# Patient Record
Sex: Female | Born: 1955 | Race: White | Hispanic: No | Marital: Single | Smoking: Never smoker
Health system: Southern US, Community
[De-identification: ages and names within clinical notes are randomized; demographics above are authoritative.]

## PROBLEM LIST (undated history)

## (undated) DIAGNOSIS — H9190 Unspecified hearing loss, unspecified ear: Secondary | ICD-10-CM

## (undated) DIAGNOSIS — E611 Iron deficiency: Secondary | ICD-10-CM

## (undated) DIAGNOSIS — R87619 Unspecified abnormal cytological findings in specimens from cervix uteri: Secondary | ICD-10-CM

## (undated) HISTORY — DX: Unspecified abnormal cytological findings in specimens from cervix uteri: R87.619

## (undated) HISTORY — DX: Iron deficiency: E61.1

## (undated) HISTORY — DX: Unspecified hearing loss, unspecified ear: H91.90

---

## 1983-12-22 HISTORY — PX: TUBAL LIGATION: SHX77

## 2004-04-22 DIAGNOSIS — R87619 Unspecified abnormal cytological findings in specimens from cervix uteri: Secondary | ICD-10-CM

## 2004-04-22 HISTORY — DX: Unspecified abnormal cytological findings in specimens from cervix uteri: R87.619

## 2005-10-20 HISTORY — PX: GASTRIC BYPASS: SHX52

## 2013-05-18 LAB — HM COLONOSCOPY: HM Colonoscopy: NORMAL

## 2013-05-18 LAB — HM MAMMOGRAPHY: HM Mammogram: NORMAL

## 2014-05-13 ENCOUNTER — Ambulatory Visit: Payer: Self-pay | Admitting: Family Medicine

## 2014-05-17 ENCOUNTER — Encounter: Payer: Self-pay | Admitting: Family Medicine

## 2014-05-17 ENCOUNTER — Ambulatory Visit (INDEPENDENT_AMBULATORY_CARE_PROVIDER_SITE_OTHER): Payer: BLUE CROSS/BLUE SHIELD | Admitting: Family Medicine

## 2014-05-17 VITALS — BP 114/62 | HR 68 | Ht 68.0 in | Wt 217.0 lb

## 2014-05-17 DIAGNOSIS — R635 Abnormal weight gain: Secondary | ICD-10-CM

## 2014-05-17 DIAGNOSIS — E611 Iron deficiency: Secondary | ICD-10-CM | POA: Insufficient documentation

## 2014-05-17 DIAGNOSIS — H9193 Unspecified hearing loss, bilateral: Secondary | ICD-10-CM

## 2014-05-17 DIAGNOSIS — H9190 Unspecified hearing loss, unspecified ear: Secondary | ICD-10-CM

## 2014-05-17 HISTORY — DX: Iron deficiency: E61.1

## 2014-05-17 HISTORY — DX: Unspecified hearing loss, unspecified ear: H91.90

## 2014-05-17 LAB — CBC
HEMATOCRIT: 42.4 % (ref 36.0–46.0)
HEMOGLOBIN: 14.1 g/dL (ref 12.0–15.0)
MCH: 27.8 pg (ref 26.0–34.0)
MCHC: 33.3 g/dL (ref 30.0–36.0)
MCV: 83.6 fL (ref 78.0–100.0)
MPV: 9.1 fL (ref 8.6–12.4)
Platelets: 320 10*3/uL (ref 150–400)
RBC: 5.07 MIL/uL (ref 3.87–5.11)
RDW: 14.1 % (ref 11.5–15.5)
WBC: 8.6 10*3/uL (ref 4.0–10.5)

## 2014-05-17 LAB — BASIC METABOLIC PANEL WITH GFR
BUN: 12 mg/dL (ref 6–23)
CALCIUM: 9.6 mg/dL (ref 8.4–10.5)
CO2: 25 mEq/L (ref 19–32)
CREATININE: 0.75 mg/dL (ref 0.50–1.10)
Chloride: 106 mEq/L (ref 96–112)
GFR, Est African American: >89 mL/min
GFR, Est Non African American: 89 mL/min
GLUCOSE: 107 mg/dL — AB (ref 70–99)
Potassium: 4.7 mEq/L (ref 3.5–5.3)
Sodium: 141 mEq/L (ref 135–145)

## 2014-05-17 LAB — IRON AND TIBC
%SAT: 12 % — ABNORMAL LOW (ref 20–55)
Iron: 39 ug/dL — ABNORMAL LOW (ref 42–145)
TIBC: 330 ug/dL (ref 250–470)
UIBC: 291 ug/dL (ref 125–400)

## 2014-05-17 NOTE — Progress Notes (Signed)
CC: Alexis Reyes is a 59 y.o. female is here for Establish Care   Subjective: HPI:  Very pleasant 59 year old here to establish care  She reports a history of iron deficiency and it is uncertain whether or not she ever was anemic due to this but going through her chart over the past year hemoglobin has always remained in the low normal range with an MCV less than 80. She's only been able to tolerate ferrous sulfate 325 mg once a day anything above this causes intolerable constipation. She wants to know of her iron levels today can be checked. There is been no recent bleeding or easy bruisability.  She reports difficulty with hearing bilaterally. It is accompanied by tinnitus bilaterally. Symptoms are mild to moderate in severity. They can occur in any situation. She gives me an example of being in a meeting today where she was unable to hear others talking around her. Nothing seems to make symptoms better or worse. She denies any history of trauma to either ear but does admit to listening to loud music over the course of her life. There is no ear pain or dizziness.  She reports difficulty with losing weight ever since she went through menopause, years ago. She is about to start the [employer] diet and wants to know if it is safe to go through with this.she is not familiar with what dietary restriction she'll have or if she'll be put on supplements/prescriptions. She's had difficulty losing weight with exercise and diet modification on her own.  Review of Systems - General ROS: negative for - chills, fever, night sweats,  weight loss Ophthalmic ROS: negative for - decreased vision Psychological ROS: negative for - anxiety or depression ENT ROS: negative for - hearing change, nasal congestion, tinnitus or allergies Hematological and Lymphatic ROS: negative for - bleeding problems, bruising or swollen lymph nodes Breast ROS: negative Respiratory ROS: no cough, shortness of breath, or  wheezing Cardiovascular ROS: no chest pain or dyspnea on exertion Gastrointestinal ROS: no abdominal pain, change in bowel habits, or black or bloody stools Genito-Urinary ROS: negative for - genital discharge, genital ulcers, incontinence or abnormal bleeding from genitals Musculoskeletal ROS: negative for - joint pain or muscle pain Neurological ROS: negative for - headaches or memory loss Dermatological ROS: negative for lumps, mole changes, rash and skin lesion changes  Past Medical History  Diagnosis Date  . Iron deficiency 05/17/2014  . Hearing loss 05/17/2014    Past Surgical History  Procedure Laterality Date  . Gastric bypass     No family history on file.  History   Social History  . Marital Status: Single    Spouse Name: N/A    Number of Children: N/A  . Years of Education: N/A   Occupational History  . Not on file.   Social History Main Topics  . Smoking status: Not on file  . Smokeless tobacco: Not on file  . Alcohol Use: Not on file  . Drug Use: Not on file  . Sexual Activity: Not on file   Other Topics Concern  . Not on file   Social History Narrative  . No narrative on file     Objective: BP 114/62 mmHg  Pulse 68  Ht 5\' 8"  (1.727 m)  Wt 217 lb (98.431 kg)  BMI 33.00 kg/m2  General: Alert and Oriented, No Acute Distress HEENT: Pupils equal, round, reactive to light. Conjunctivae clear.  External ears unremarkable, canals clear with intact TMs with appropriate landmarks.  Middle  ear appears open without effusion. Rinne test normal bilaterally, Weber lateralizes to the right Lungs: clear comfortable work of breathing Cardiac: Regular rate and rhythm.  Abdomen: mild obesity Extremities: No peripheral edema.  Strong peripheral pulses.  Mental Status: No depression, anxiety, nor agitation. Skin: Warm and dry.  Assessment & Plan: Otis was seen today for establish care.  Diagnoses and associated orders for this visit:  Iron deficiency - Iron  and TIBC - CBC  Hearing loss, bilateral - Ambulatory referral to Audiology - Iron and TIBC - CBC - BASIC METABOLIC PANEL WITH GFR  Abnormal weight gain    Iron deficiency: checking iron, MCV, hemoglobin To help determine if she needs to adjust her oral iron supplementation Bilateral hearing loss: Referral to audiology, suspect that hearing loss is conductive. Abnormal weight gain: Check a metabolic panel, I reviewed the [employer] diet plan website and was unable to provide her with any recommendations since they do not list what dietary restrictions their clients undergo or if they're provided with any supplements/prescription. I let the patient noted that after her consultation in February with this company if they give her any of the above information I be happy to review it for her if she drops off at my office.  Ultimate follow-up will be based on the above results   Return if symptoms worsen or fail to improve.

## 2014-05-18 ENCOUNTER — Encounter: Payer: Self-pay | Admitting: *Deleted

## 2014-05-18 ENCOUNTER — Telehealth: Payer: Self-pay | Admitting: Family Medicine

## 2014-05-18 DIAGNOSIS — R739 Hyperglycemia, unspecified: Secondary | ICD-10-CM

## 2014-05-18 NOTE — Telephone Encounter (Signed)
Seth Bake, Will you please let patient know that she still has a mild iron deficiency however it's not causing any degree of anemia so I don't feel that she needs to adjust her current iron supplementation regimen.  Her non-fasting blood sugar was mildly elevated so I'd recommend she have an A1c obtained, I'll print off an order if this can not be added on to yesterdays labs.

## 2014-05-18 NOTE — Telephone Encounter (Signed)
Pt.notified

## 2014-05-19 ENCOUNTER — Ambulatory Visit (INDEPENDENT_AMBULATORY_CARE_PROVIDER_SITE_OTHER): Payer: BLUE CROSS/BLUE SHIELD | Admitting: Obstetrics & Gynecology

## 2014-05-19 ENCOUNTER — Encounter: Payer: Self-pay | Admitting: Obstetrics & Gynecology

## 2014-05-19 VITALS — BP 107/64 | HR 62 | Resp 16 | Ht 67.0 in | Wt 218.0 lb

## 2014-05-19 DIAGNOSIS — Z1151 Encounter for screening for human papillomavirus (HPV): Secondary | ICD-10-CM

## 2014-05-19 DIAGNOSIS — N841 Polyp of cervix uteri: Secondary | ICD-10-CM

## 2014-05-19 DIAGNOSIS — Z01411 Encounter for gynecological examination (general) (routine) with abnormal findings: Secondary | ICD-10-CM | POA: Diagnosis not present

## 2014-05-19 DIAGNOSIS — Z124 Encounter for screening for malignant neoplasm of cervix: Secondary | ICD-10-CM

## 2014-05-19 NOTE — Addendum Note (Signed)
Addended by: Gretchen Short on: 05/19/2014 05:07 PM   Modules accepted: Orders

## 2014-05-19 NOTE — Progress Notes (Signed)
  Subjective:    Alexis Reyes is a 59 y.o. female who presents for an annual exam. The patient has no complaints today. The patient is not sexually active. GYN screening history: last pap: was normal. Had LEEP 10 years ago.  We ned records.  Also has history of endocervial polyps removed several years ago. The patient wears seatbelts: yes. The patient participates in regular exercise: no. Has the patient ever been transfused or tattooed?: no. The patient reports that there is not domestic violence in her life.   Menstrual History: OB History    Gravida Para Term Preterm AB TAB SAB Ectopic Multiple Living   2 2 2       2      No LMP recorded. Patient is postmenopausal.    The following portions of the patient's history were reviewed and updated as appropriate: allergies, current medications, past family history, past medical history, past social history, past surgical history and problem list.  Review of Systems Pertinent items are noted in HPI.    Objective:      Filed Vitals:   05/19/14 1558  BP: 107/64  Pulse: 62  Resp: 16  Height: 5\' 7"  (1.702 m)  Weight: 218 lb (98.884 kg)   Vitals:  WNL General appearance: alert, cooperative and no distress Head: Normocephalic, without obvious abnormality, atraumatic Eyes: negative Throat: lips, mucosa, and tongue normal; teeth and gums normal Lungs: clear to auscultation bilaterally Breasts: normal appearance, no masses or tenderness, No nipple retraction or dimpling, No nipple discharge or bleeding Heart: regular rate and rhythm Abdomen: soft, non-tender; bowel sounds normal; no masses,  no organomegaly  Pelvic:  External Genitalia:  Tanner V, no lesion Urethra:  No prolapse Vagina:  Pink, normal rugae, no blood or discharge Cervix:  4+ small endocervical polpys, miild uterine prolapse Uterus:  Normal size and contour, non tender Adnexa:  Normal, no masses, non tender Rectovaginal Septum:  Non tender, no masses  Extremities:  no edema, redness or tenderness in the calves or thighs Skin: no lesions or rash Lymph nodes: Axillary adenopathy: none     .    Assessment:    Healthy female exam.   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 Up to date with colonoscopy per patient.  4+ endocervical polyps were removed.  Bleeding stopped with silver nitrate.   Small polyp at 12 o clock could not be entirely removed.    RTC 3 weeks.  Plan:

## 2014-05-20 LAB — HEMOGLOBIN A1C
Hgb A1c MFr Bld: 5.4 % (ref <5.7)
Mean Plasma Glucose: 108 mg/dL (ref <117)

## 2014-05-23 LAB — CYTOLOGY - PAP

## 2014-05-26 ENCOUNTER — Telehealth: Payer: Self-pay | Admitting: *Deleted

## 2014-05-26 NOTE — Telephone Encounter (Signed)
Pt notified of neg pathology report of benign polyp.

## 2014-06-14 ENCOUNTER — Ambulatory Visit: Payer: BLUE CROSS/BLUE SHIELD | Admitting: Obstetrics & Gynecology

## 2014-06-22 ENCOUNTER — Ambulatory Visit: Payer: BLUE CROSS/BLUE SHIELD | Attending: Audiology | Admitting: Audiology

## 2014-06-22 DIAGNOSIS — H93293 Other abnormal auditory perceptions, bilateral: Secondary | ICD-10-CM

## 2014-06-22 DIAGNOSIS — H903 Sensorineural hearing loss, bilateral: Secondary | ICD-10-CM | POA: Diagnosis not present

## 2014-06-22 DIAGNOSIS — H9319 Tinnitus, unspecified ear: Secondary | ICD-10-CM | POA: Insufficient documentation

## 2014-06-22 DIAGNOSIS — H93213 Auditory recruitment, bilateral: Secondary | ICD-10-CM

## 2014-06-22 NOTE — Procedures (Signed)
[facility] [address] [place], [place] [address] [place] EVALUATION  Name: Alexis Reyes DOB:  01-28-1956 MRN:  [id number]                                 Diagnosis: Hearing loss, Tinnitus Date: 06/22/2014    Referent: Marcial Pacas, DO  HISTORY: Alexis Reyes, age 59 y.o. years, was seen for an audiological evaluation.  Her primary complaint is "difficulty hearing when people speak in a low voice or there is background noise".   She states that she had a hearing test about last year but that she has noticed her hearing and tinnitus getting worse over the past 3 years.  Alexis Reyes states that the tinnitus began about 3 years ago and now is constant - sometimes she cannot hear people over the loudness of the tinnitus. This is difficult for her at work and at meetings.  However, on a scale of 1 (no impact) -10 (10 ruined) she describes the tinnitus as a 3. Nyhla denies vertigo or balance issues.  Alexis Reyes denies a feeling of "pressure in the ears".  Alexis Reyes reports having an ear infection in the past.         EVALUATION: Pure tone air and tone conduction was completed using conventional audiometry with inserts.  Hearing thresholds are 15-20 dBHL from 250Hz  - 500Hz ; 25-30 dBHL at 1000Hz ; 55-65 dBHL at 1500Hz -4000Hz  and 50-60 dBHL from 6000Hz  - 8000Hz  bilaterally. The hearing loss is sensorineural with recruitment reported at [place] in each ear using monitored live voice.  Speech reception thresholds are 25 dBHL in the right ear and 25 dBHL in the left ear using recorded spondee words.  The reliability is good.  Word recognition is 56% at 65dBHL in the right and 68% at 65dBHL in the left using recorded NU-6 word lists in quiet. In minimal background noise with +5dB signal to noise ratio word recognition is 36 % in the right ear and 42% in the left ear. Otoscopic inspection reveals clear ear canals with visible  tympanic membranes.   Tympanometry has a long left leg bilaterally- the right ear has a large gradient of 235daPa  and in the left ear has a similar configuration.  Ipsilateral acoustic reflexes are within normal limits bilaterally.  Tinnitus was matched for loudness at  is 66dBHL using speech noise but she could not tell the pitch of the tinnitus. Suppression was positive 72 dBHL of speech noise for 60 seconds.   CONCLUSION:      Alexis Reyes has a severe communication deficit. Currently her word recognition is poor in quiet and drops to very poor in minimal background noise.  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   A progressive drop in word recognition and hearing thresholds is of concern. The tinnitus also seems to be worse than one year ago by history; however, since suppression was noted today Alexis Reyes may be a good candidate for tinnitus therapy.  As discussed with Alexis Reyes follow-up with an ENT is strongly recommended and she desires to follow-up with Dr. Royce Macadamia, ENT in [place], where she lives.    RECOMMENDATIONS: 1.   Further evaluation by Dr. Valora Corporal, ENT for tinnitus, hearing loss and poor word recognition. 2.    Monitor hearing closely with a repeat  audiological evaluation in 3-6 months (earlier if there is any change in hearing or ear pressure). 3.   To minimize the adverse effects of tinnitus 1) avoid quiet  2) use noise maskers at home such as a sound machine, quiet music, a fan or other background noise at a volume just loud enough to mask the high pitched tinnitus. 3) If the tinnitus becomes more bothersome, adversely affecting your sleep or concentration, contact your physician,  seek additional medical help by an ENT for further treatment of your tinnitus.   Coolidge Gossard L. Heide Spark, Au.D., CCC-A Doctor of Audiology   06/22/2014  cc: Marcial Pacas,  DO

## 2014-09-22 ENCOUNTER — Emergency Department (INDEPENDENT_AMBULATORY_CARE_PROVIDER_SITE_OTHER)
Admission: EM | Admit: 2014-09-22 | Discharge: 2014-09-22 | Disposition: A | Discharge status: Home / Self Care | Payer: BLUE CROSS/BLUE SHIELD | Source: Home / Self Care | Attending: Emergency Medicine | Admitting: Emergency Medicine

## 2014-09-22 ENCOUNTER — Encounter: Payer: Self-pay | Admitting: Emergency Medicine

## 2014-09-22 DIAGNOSIS — N3001 Acute cystitis with hematuria: Secondary | ICD-10-CM

## 2014-09-22 LAB — POCT URINALYSIS DIP (MANUAL ENTRY)
Bilirubin, UA: NEGATIVE
Glucose, UA: NEGATIVE
Ketones, POC UA: NEGATIVE
Leukocytes, UA: NEGATIVE
Nitrite, UA: NEGATIVE
Protein Ur, POC: NEGATIVE
Spec Grav, UA: <=1.005 (ref 1.005–1.03)
Urobilinogen, UA: 0.2 (ref 0–1)
pH, UA: 6 (ref 5–8)

## 2014-09-22 MED ORDER — CIPROFLOXACIN HCL 500 MG PO TABS: 500.0000 mg | ORAL_TABLET | Freq: Two times a day (BID) | ORAL | Status: DC

## 2014-09-22 NOTE — ED Provider Notes (Signed)
CSN: [id number]     Arrival date & time 09/22/14  1335 History   None    Chief Complaint  Patient presents with  . Dysuria    HPI This is a 59 y.o. female who presents today with UTI symptoms for 1 days.  + dysuria + frequency + urgency No hematuria No vaginal discharge No fever/chills No lower abdominal pain No nausea No vomiting No back pain No fatigue She denies chance of pregnancy. Has tried over-the-counter measures without improvement.    Past Medical History  Diagnosis Date  . Iron deficiency 05/17/2014  . Hearing loss 05/17/2014  . Abnormal Pap smear of cervix 2006    colpo   Past Surgical History  Procedure Laterality Date  . Gastric bypass  10/2005  . Tubal ligation  12/22/1983   Family History  Problem Relation Age of Onset  . Diabetes Mother     father   . Stroke Father    History  Substance Use Topics  . Smoking status: Never Smoker   . Smokeless tobacco: Never Used  . Alcohol Use: 0.6 oz/week    1 Standard drinks or equivalent per week   OB History    Gravida Para Term Preterm AB TAB SAB Ectopic Multiple Living   2 2 2       2      Review of Systems Remainder of Review of Systems negative for acute change except as noted in the HPI.  Allergies  Review of patient's allergies indicates no known allergies.  Home Medications   Prior to Admission medications   Medication Sig Start Date End Date Taking? Authorizing Provider  Ascorbic Acid (VITAMIN C) 1000 MG tablet Take 1,000 mg by mouth.    Historical Provider, MD  Calcium-Vitamin D-Vitamin K [id number] MG-UNT-MCG CHEW Chew by mouth.    Historical Provider, MD  ciprofloxacin (CIPRO) 500 MG tablet Take 1 tablet (500 mg total) by mouth 2 (two) times daily. For 7 days 09/22/14   Jacqulyn Cane, MD  Docusate Calcium (STOOL SOFTENER PO) Take by mouth.    Historical Provider, MD  IRON PO Take by mouth.    Historical Provider, MD  Multiple Vitamin (THERA) TABS Take 1 tablet by mouth.    Historical  Provider, MD  Multiple Vitamins-Minerals (MULTIVITAMIN ADULT PO) Take by mouth.    Historical Provider, MD  vitamin B-12 (CYANOCOBALAMIN) 1000 MCG tablet Take 1,000 mcg by mouth.    Historical Provider, MD  vitamin E 400 UNIT capsule Take 400 Units by mouth.    Historical Provider, MD   BP 103/69 mmHg  Pulse 73  Temp(Src) 98 F (36.7 C) (Oral)  Resp 16  Ht 5\' 8"  (1.727 m)  Wt 200 lb (90.719 kg)  BMI 30.42 kg/m2  SpO2 98% Physical Exam  Constitutional: She is oriented to person, place, and time. She appears well-developed and well-nourished. No distress.  HENT:  Mouth/Throat: Oropharynx is clear and moist.  Eyes: No scleral icterus.  Neck: Neck supple.  Cardiovascular: Normal rate, regular rhythm and normal heart sounds.   Pulmonary/Chest: Breath sounds normal.  Abdominal: Soft. She exhibits no mass. There is no hepatosplenomegaly. There is tenderness in the suprapubic area. There is no rebound, no guarding and no CVA tenderness.  Lymphadenopathy:    She has no cervical adenopathy.  Neurological: She is alert and oriented to person, place, and time.  Skin: Skin is warm and dry.  Nursing note and vitals reviewed.   ED Course  Procedures (including critical care time)  Labs Review Labs Reviewed  URINE CULTURE  POCT URINALYSIS DIP (MANUAL ENTRY)   Results for orders placed or performed during the hospital encounter of 09/22/14  POCT urinalysis dipstick (new)  Result Value Ref Range   Color, UA light yellow    Clarity, UA clear    Glucose, UA neg    Bilirubin, UA negative    Bilirubin, UA negative    Spec Grav, UA <=1.005 1.005 - 1.03   Blood, UA small    pH, UA 6.0 5 - 8   Protein Ur, POC negative    Urobilinogen, UA 0.2 0 - 1   Nitrite, UA Negative    Leukocytes, UA Negative      Imaging Review No results found.   MDM   1. Acute cystitis with hematuria    Treatment options discussed, as well as risks, benefits, alternatives. Patient voiced understanding and  agreement with the following plans: New Prescriptions   CIPROFLOXACIN (CIPRO) 500 MG TABLET    Take 1 tablet (500 mg total) by mouth 2 (two) times daily. For 7 days   other symptomatic care discussed Urine culture Follow-up with your primary care doctor in 5-7 days if not improving, or sooner if symptoms become worse. Precautions discussed. Red flags discussed. Questions invited and answered. Patient voiced understanding and agreement.      Jacqulyn Cane, MD 09/22/14 [phone]

## 2014-09-24 ENCOUNTER — Telehealth: Payer: Self-pay | Admitting: *Deleted

## 2014-09-24 LAB — URINE CULTURE
Colony Count: NO GROWTH
Organism ID, Bacteria: NO GROWTH

## 2014-09-26 ENCOUNTER — Telehealth: Payer: Self-pay | Admitting: *Deleted

## 2014-09-26 MED ORDER — FLUCONAZOLE 150 MG PO TABS: 150.0000 mg | ORAL_TABLET | Freq: Every day | ORAL | Status: DC

## 2014-10-03 ENCOUNTER — Emergency Department (INDEPENDENT_AMBULATORY_CARE_PROVIDER_SITE_OTHER)
Admission: EM | Admit: 2014-10-03 | Discharge: 2014-10-03 | Disposition: A | Discharge status: Home / Self Care | Payer: BLUE CROSS/BLUE SHIELD | Source: Home / Self Care | Attending: Emergency Medicine | Admitting: Emergency Medicine

## 2014-10-03 ENCOUNTER — Encounter: Payer: Self-pay | Admitting: Emergency Medicine

## 2014-10-03 DIAGNOSIS — N3001 Acute cystitis with hematuria: Secondary | ICD-10-CM | POA: Diagnosis not present

## 2014-10-03 LAB — POCT URINALYSIS DIP (MANUAL ENTRY)
Bilirubin, UA: NEGATIVE
Glucose, UA: NEGATIVE
Ketones, POC UA: NEGATIVE
Nitrite, UA: NEGATIVE
Protein Ur, POC: NEGATIVE
Spec Grav, UA: 1.015 (ref 1.005–1.03)
Urobilinogen, UA: 0.2 (ref 0–1)
pH, UA: 6 (ref 5–8)

## 2014-10-03 MED ORDER — FLUCONAZOLE 150 MG PO TABS: 150.0000 mg | ORAL_TABLET | Freq: Once | ORAL | Status: DC

## 2014-10-03 MED ORDER — NITROFURANTOIN MONOHYD MACRO 100 MG PO CAPS: 100.0000 mg | ORAL_CAPSULE | Freq: Two times a day (BID) | ORAL | Status: DC

## 2014-10-03 NOTE — ED Notes (Signed)
Pt c/o urinary frequency, urgency pain and pressure. States she finished cipro on 6/10... No improvement.

## 2014-10-03 NOTE — ED Provider Notes (Signed)
CSN: [id number]     Arrival date & time 10/03/14  0805 History   First MD Initiated Contact with Patient 10/03/14 0820     Chief Complaint  Patient presents with  . Urinary Tract Infection    HPI This is a 58 y.o. female who presents today with UTI symptoms for 11 days.  Was seen here on June 6, prescribed Cipro for UTI, she states she improved somewhat but never completely improved. Urine culture showed no growth.  + dysuria + frequency + urgency No hematuria No vaginal discharge No fever/chills No lower abdominal pain No nausea No vomiting No back pain No fatigue She denies chance of pregnancy.     Past Medical History  Diagnosis Date  . Iron deficiency 05/17/2014  . Hearing loss 05/17/2014  . Abnormal Pap smear of cervix 2006    colpo   Past Surgical History  Procedure Laterality Date  . Gastric bypass  10/2005  . Tubal ligation  12/22/1983   Family History  Problem Relation Age of Onset  . Diabetes Mother     father   . Stroke Father    History  Substance Use Topics  . Smoking status: Never Smoker   . Smokeless tobacco: Never Used  . Alcohol Use: 0.6 oz/week    1 Standard drinks or equivalent per week   OB History    Gravida Para Term Preterm AB TAB SAB Ectopic Multiple Living   2 2 2       2      Review of Systems Remainder of Review of Systems negative for acute change except as noted in the HPI.  Allergies  Review of patient's allergies indicates no known allergies.  Home Medications   Prior to Admission medications   Medication Sig Start Date End Date Taking? Authorizing Provider  Ascorbic Acid (VITAMIN C) 1000 MG tablet Take 1,000 mg by mouth.    Historical Provider, MD  Calcium-Vitamin D-Vitamin K [id number] MG-UNT-MCG CHEW Chew by mouth.    Historical Provider, MD  Docusate Calcium (STOOL SOFTENER PO) Take by mouth.    Historical Provider, MD  fluconazole (DIFLUCAN) 150 MG tablet Take 1 tablet (150 mg total) by mouth once. Take 1 now, then  may repeat x1 in 4 days, for yeast infection. 10/03/14   Jacqulyn Cane, MD  IRON PO Take by mouth.    Historical Provider, MD  Multiple Vitamin (THERA) TABS Take 1 tablet by mouth.    Historical Provider, MD  Multiple Vitamins-Minerals (MULTIVITAMIN ADULT PO) Take by mouth.    Historical Provider, MD  nitrofurantoin, macrocrystal-monohydrate, (MACROBID) 100 MG capsule Take 1 capsule (100 mg total) by mouth 2 (two) times daily. 10/03/14   Jacqulyn Cane, MD  vitamin B-12 (CYANOCOBALAMIN) 1000 MCG tablet Take 1,000 mcg by mouth.    Historical Provider, MD  vitamin E 400 UNIT capsule Take 400 Units by mouth.    Historical Provider, MD   BP 105/69 mmHg  Pulse 66  Temp(Src) 98.4 F (36.9 C) (Oral)  SpO2 98% Physical Exam  Constitutional: She is oriented to person, place, and time. She appears well-developed and well-nourished. No distress.  HENT:  Head: Normocephalic and atraumatic.  Eyes: Conjunctivae and EOM are normal. Pupils are equal, round, and reactive to light. No scleral icterus.  Neck: Normal range of motion.  Cardiovascular: Normal rate.   Pulmonary/Chest: Effort normal.  Abdominal: She exhibits no distension.  Musculoskeletal: Normal range of motion.  Neurological: She is alert and oriented to person, place, and time.  Skin: Skin is warm.  Psychiatric: She has a normal mood and affect.  Nursing note and vitals reviewed.  she declined any other physical exam today  ED Course  Procedures (including critical care time) Labs Review Labs Reviewed  URINE CULTURE  POCT URINALYSIS DIP (MANUAL ENTRY)   Results for orders placed or performed during the hospital encounter of 09/22/14  Urine culture  Result Value Ref Range   Colony Count NO GROWTH    Organism ID, Bacteria NO GROWTH                                                                      Today's urinalysis: Positive for small amount of blood and trace leukocytes.  MDM   1. Acute cystitis with hematuria    Treatment options discussed, as well as risks, benefits, alternatives. Patient voiced understanding and agreement with the following plans:  Macrobid prescribed, she states that's worked well in the past. Diflucan rx in case she gets yeast infection I urged her to follow up with PCP and/or GYN and/or urologist within 7-10 days, sooner if worse or new or persistent symptoms. I gave her names and contact information for local urologist. Precautions discussed. Red flags discussed. Questions invited and answered. Patient voiced understanding and agreement.  Precautions and red flags discussed. She voiced understanding and agreement   Jacqulyn Cane, MD 10/03/14 [phone]

## 2014-10-05 ENCOUNTER — Telehealth: Payer: Self-pay | Admitting: *Deleted

## 2014-10-05 LAB — URINE CULTURE
Colony Count: NO GROWTH
Organism ID, Bacteria: NO GROWTH

## 2014-11-30 ENCOUNTER — Encounter: Payer: Self-pay | Admitting: Family Medicine

## 2014-11-30 ENCOUNTER — Ambulatory Visit (INDEPENDENT_AMBULATORY_CARE_PROVIDER_SITE_OTHER): Payer: BLUE CROSS/BLUE SHIELD | Admitting: Family Medicine

## 2014-11-30 VITALS — BP 102/59 | HR 68 | Ht 68.0 in | Wt 203.0 lb

## 2014-11-30 DIAGNOSIS — Z0000 Encounter for general adult medical examination without abnormal findings: Secondary | ICD-10-CM | POA: Diagnosis not present

## 2014-11-30 DIAGNOSIS — R319 Hematuria, unspecified: Secondary | ICD-10-CM | POA: Diagnosis not present

## 2014-11-30 DIAGNOSIS — R079 Chest pain, unspecified: Secondary | ICD-10-CM

## 2014-11-30 DIAGNOSIS — L989 Disorder of the skin and subcutaneous tissue, unspecified: Secondary | ICD-10-CM

## 2014-11-30 DIAGNOSIS — E785 Hyperlipidemia, unspecified: Secondary | ICD-10-CM | POA: Diagnosis not present

## 2014-11-30 DIAGNOSIS — Z1239 Encounter for other screening for malignant neoplasm of breast: Secondary | ICD-10-CM | POA: Diagnosis not present

## 2014-11-30 LAB — POCT URINALYSIS DIPSTICK
BILIRUBIN UA: NEGATIVE
Glucose, UA: NEGATIVE
Ketones, UA: NEGATIVE
LEUKOCYTES UA: NEGATIVE
NITRITE UA: NEGATIVE
PH UA: 7
PROTEIN UA: NEGATIVE
Spec Grav, UA: 1.015
Urobilinogen, UA: 0.2

## 2014-11-30 NOTE — Progress Notes (Signed)
CC: Alexis Reyes is a 59 y.o. female is here for Annual Exam   Subjective: HPI:  Colonoscopy: UTD as of 2015 Papsmear: Normal this year Mammogram: Normal 2015, repeat overdue, orders placed  Influenza Vaccine: No current indication Pneumovax: No current indication Td/Tdap: Up-to-date Zoster: (Start 58 yo)  Requesting complete physical exam  Acute complaint of chest pain that she has been attributed to gastric reflux symptoms. Nothing particularly makes it better or worse. It is not predictable. It can happen anytime of the day. No exertional component. She is not having any pain right now but would like an EKG.  Her father was diagnosed with carotid artery disease that required stenting. She is worried that she might have carotid artery disease herself. She denies any recent motor or sensory disturbances other than dysuria. She would like she can have an ultrasound of her carotids.  Review of Systems - General ROS: negative for - chills, fever, night sweats, weight gain or weight loss Ophthalmic ROS: negative for - decreased vision Psychological ROS: negative for - anxiety or depression ENT ROS: negative for - hearing change, nasal congestion, tinnitus or allergies Hematological and Lymphatic ROS: negative for - bleeding problems, bruising or swollen lymph nodes Breast ROS: negative Respiratory ROS: no cough, shortness of breath, or wheezing Cardiovascular ROS: no  dyspnea on exertion Gastrointestinal ROS: no abdominal pain, change in bowel habits, or black or bloody stools Genito-Urinary ROS: negative for - genital discharge, genital ulcers, incontinence or abnormal bleeding from genitals Musculoskeletal ROS: negative for - joint pain or muscle pain Neurological ROS: negative for - headaches or memory loss Dermatological ROS: negative for lumps, mole changes, rash and skin lesion changes  Past Medical History  Diagnosis Date  . Iron deficiency 05/17/2014  . Hearing loss  05/17/2014  . Abnormal Pap smear of cervix 2006    colpo    Past Surgical History  Procedure Laterality Date  . Gastric bypass  10/2005  . Tubal ligation  12/22/1983   Family History  Problem Relation Age of Onset  . Diabetes Mother     father   . Stroke Father     Social History   Social History  . Marital Status: Single    Spouse Name: N/A  . Number of Children: N/A  . Years of Education: N/A   Occupational History  . Not on file.   Social History Main Topics  . Smoking status: Never Smoker   . Smokeless tobacco: Never Used  . Alcohol Use: 0.6 oz/week    1 Standard drinks or equivalent per week  . Drug Use: No  . Sexual Activity:    Partners: Male   Other Topics Concern  . Not on file   Social History Narrative     Objective: BP 102/59 mmHg  Pulse 68  Ht 5\' 8"  (1.727 m)  Wt 203 lb (92.08 kg)  BMI 30.87 kg/m2  General: No Acute Distress HEENT: Atraumatic, normocephalic, conjunctivae normal without scleral icterus.  No nasal discharge, hearing grossly intact, TMs with good landmarks bilaterally with no middle ear abnormalities, posterior pharynx clear without oral lesions. Neck: Supple, trachea midline, no cervical nor supraclavicular adenopathy. Pulmonary: Clear to auscultation bilaterally without wheezing, rhonchi, nor rales. Cardiac: Regular rate and rhythm.  No murmurs, rubs, nor gallops. No peripheral edema.  2+ peripheral pulses bilaterally. No carotid bruits Abdomen: Mild obesity soft MSK: Grossly intact, no signs of weakness.  Full strength throughout upper and lower extremities.  Full ROM in upper and lower  extremities.  No midline spinal tenderness. Neuro: Gait unremarkable, CN II-XII grossly intact.  C5-C6 Reflex 2/4 Bilaterally, L4 Reflex 2/4 Bilaterally.  Cerebellar function intact. Skin: No rashes other than noninflamed seborrheic keratoses on the back Psych: Alert and oriented to person/place/time.  Thought process normal. No  anxiety/depression.   Assessment & Plan: Alexis Reyes was seen today for annual exam.  Diagnoses and all orders for this visit:  Annual physical exam -     CBC -     COMPLETE METABOLIC PANEL WITH GFR -     Lipid panel  Screening for breast cancer -     MM DIGITAL SCREENING BILATERAL  Skin lesion -     Ambulatory referral to Dermatology  Hyperlipidemia -     US Carotid Duplex Bilateral  Hematuria -     POCT urinalysis dipstick -     Urine Culture  Chest pain, unspecified chest pain type   Healthy lifestyle interventions including but not limited to regular exercise, a healthy low fat diet, moderation of salt intake, the dangers of tobacco/alcohol/recreational drug use, nutrition supplementation, and accident avoidance were discussed with the patient and a handout was provided for future reference. EKG was obtained showing sinus bradycardia, normal intervals, normal axis, no pathologic Q waves, no ST segment elevation or depression. Reassurance provided low suspicion for cardiac etiology of pain so far however obtain labs above. Reassurance provided about her seborrheic keratoses, she would like to have a second opinion with dermatology. Urinalysis today shows blood, sending for urine culture before starting antibiotic. Discussed that there is no strong evidence for promotion of screening carotid ultrasounds for a asymptomatic patient however she would still gets on like to have this done.   Return if symptoms worsen or fail to improve.

## 2014-12-01 ENCOUNTER — Other Ambulatory Visit: Payer: Self-pay | Admitting: Family Medicine

## 2014-12-01 DIAGNOSIS — D509 Iron deficiency anemia, unspecified: Secondary | ICD-10-CM

## 2014-12-01 NOTE — Addendum Note (Signed)
Addended by: Huel Cote on: 12/01/2014 09:12 AM   Modules accepted: Orders

## 2014-12-02 LAB — URINE CULTURE

## 2014-12-07 LAB — COMPLETE METABOLIC PANEL WITH GFR
ALT: 14 U/L (ref 6–29)
AST: 18 U/L (ref 10–35)
Albumin: 3.7 g/dL (ref 3.6–5.1)
Alkaline Phosphatase: 70 U/L (ref 33–130)
BUN: 10 mg/dL (ref 7–25)
CHLORIDE: 105 mmol/L (ref 98–110)
CO2: 29 mmol/L (ref 20–31)
Calcium: 9.2 mg/dL (ref 8.6–10.4)
Creat: 0.76 mg/dL (ref 0.50–1.05)
GFR, EST NON AFRICAN AMERICAN: 87 mL/min (ref >=60)
GFR, Est African American: >89 mL/min (ref >=60)
GLUCOSE: 76 mg/dL (ref 65–99)
POTASSIUM: 4.2 mmol/L (ref 3.5–5.3)
SODIUM: 141 mmol/L (ref 135–146)
Total Bilirubin: 0.5 mg/dL (ref 0.2–1.2)
Total Protein: 6.5 g/dL (ref 6.1–8.1)

## 2014-12-07 LAB — CBC
HCT: 40.6 % (ref 36.0–46.0)
Hemoglobin: 13.2 g/dL (ref 12.0–15.0)
MCH: 27.5 pg (ref 26.0–34.0)
MCHC: 32.5 g/dL (ref 30.0–36.0)
MCV: 84.6 fL (ref 78.0–100.0)
MPV: 9.3 fL (ref 8.6–12.4)
PLATELETS: 250 10*3/uL (ref 150–400)
RBC: 4.8 MIL/uL (ref 3.87–5.11)
RDW: 14.1 % (ref 11.5–15.5)
WBC: 6.8 10*3/uL (ref 4.0–10.5)

## 2014-12-07 LAB — LIPID PANEL
Cholesterol: 149 mg/dL (ref 125–200)
HDL: 42 mg/dL — AB (ref >=46)
LDL CALC: 92 mg/dL (ref <130)
Total CHOL/HDL Ratio: 3.5 Ratio (ref <=5.0)
Triglycerides: 76 mg/dL (ref <150)
VLDL: 15 mg/dL (ref <30)

## 2014-12-07 LAB — FERRITIN: Ferritin: 39 ng/mL (ref 10–291)

## 2014-12-13 ENCOUNTER — Ambulatory Visit (HOSPITAL_BASED_OUTPATIENT_CLINIC_OR_DEPARTMENT_OTHER)
Admission: RE | Admit: 2014-12-13 | Discharge: 2014-12-13 | Disposition: A | Discharge status: Home / Self Care | Payer: BLUE CROSS/BLUE SHIELD | Source: Ambulatory Visit | Attending: Family Medicine | Admitting: Family Medicine

## 2014-12-13 DIAGNOSIS — E785 Hyperlipidemia, unspecified: Secondary | ICD-10-CM | POA: Insufficient documentation

## 2014-12-13 DIAGNOSIS — Z8249 Family history of ischemic heart disease and other diseases of the circulatory system: Secondary | ICD-10-CM | POA: Insufficient documentation

## 2014-12-13 DIAGNOSIS — I6523 Occlusion and stenosis of bilateral carotid arteries: Secondary | ICD-10-CM | POA: Insufficient documentation

## 2014-12-13 DIAGNOSIS — Z1231 Encounter for screening mammogram for malignant neoplasm of breast: Secondary | ICD-10-CM | POA: Diagnosis not present

## 2014-12-14 ENCOUNTER — Telehealth: Payer: Self-pay | Admitting: Family Medicine

## 2014-12-14 DIAGNOSIS — I6529 Occlusion and stenosis of unspecified carotid artery: Secondary | ICD-10-CM

## 2014-12-14 MED ORDER — ASPIRIN EC 81 MG PO TBEC: 81.0000 mg | DELAYED_RELEASE_TABLET | Freq: Every day | ORAL | Status: AC

## 2014-12-14 NOTE — Telephone Encounter (Signed)
Alexis Reyes, Will you please let patient know that her ultrasound showed mild cholesterol buildup in her carotid arteries that are thankfully not significantly interfering with blood flow. Since her cholesterol was controlled My only recommendation would be to start a 81mg  aspirin daily to help reduce the risk of stroke.

## 2014-12-14 NOTE — Telephone Encounter (Signed)
Left message on vm with results  

## 2015-07-18 ENCOUNTER — Encounter: Payer: Self-pay | Admitting: Obstetrics & Gynecology

## 2015-07-18 ENCOUNTER — Ambulatory Visit (INDEPENDENT_AMBULATORY_CARE_PROVIDER_SITE_OTHER): Payer: Managed Care, Other (non HMO) | Admitting: Obstetrics & Gynecology

## 2015-07-18 VITALS — BP 102/63 | HR 62 | Resp 16 | Ht 68.0 in | Wt 206.0 lb

## 2015-07-18 DIAGNOSIS — N812 Incomplete uterovaginal prolapse: Secondary | ICD-10-CM | POA: Diagnosis not present

## 2015-07-18 DIAGNOSIS — Z124 Encounter for screening for malignant neoplasm of cervix: Secondary | ICD-10-CM

## 2015-07-18 DIAGNOSIS — Z1151 Encounter for screening for human papillomavirus (HPV): Secondary | ICD-10-CM | POA: Diagnosis not present

## 2015-07-18 DIAGNOSIS — N8110 Cystocele, unspecified: Secondary | ICD-10-CM | POA: Diagnosis not present

## 2015-07-18 DIAGNOSIS — N393 Stress incontinence (female) (male): Secondary | ICD-10-CM | POA: Insufficient documentation

## 2015-07-18 DIAGNOSIS — Z01419 Encounter for gynecological examination (general) (routine) without abnormal findings: Secondary | ICD-10-CM | POA: Diagnosis not present

## 2015-07-18 DIAGNOSIS — IMO0002 Reserved for concepts with insufficient information to code with codable children: Secondary | ICD-10-CM

## 2015-07-18 DIAGNOSIS — IMO0001 Reserved for inherently not codable concepts without codable children: Secondary | ICD-10-CM | POA: Insufficient documentation

## 2015-07-18 DIAGNOSIS — N950 Postmenopausal bleeding: Secondary | ICD-10-CM

## 2015-07-18 DIAGNOSIS — N816 Rectocele: Secondary | ICD-10-CM

## 2015-07-18 MED ORDER — MISOPROSTOL 200 MCG PO TABS: ORAL_TABLET | ORAL | Status: DC

## 2015-07-18 NOTE — Progress Notes (Signed)
Subjective:    Alexis Reyes is a 60 y.o. DW P2 (5 and 31 yo kids, 26 yo granddaughter) female who presents for an annual exam. The patient has no complaints today. She went through menopause about 18 months ago but had some bleeding last week.  The patient is not currently sexually active. GYN screening history: last pap: was normal. The patient wears seatbelts: yes. The patient participates in regular exercise: no. Has the patient ever been transfused or tattooed?: no. The patient reports that there is not domestic violence in her life. She also reports that she has GSUI at times for about a year. She does not wear pads. She feels like something is bulging out of her vagina.  Menstrual History: OB History    Gravida Para Term Preterm AB TAB SAB Ectopic Multiple Living   2 2 2       2       Menarche age: 67  No LMP recorded. Patient is postmenopausal.    The following portions of the patient's history were reviewed and updated as appropriate: allergies, current medications, past family history, past medical history, past social history, past surgical history and problem list.  Review of Systems Pertinent items noted in HPI and remainder of comprehensive ROS otherwise negative. She declines flu vaccine. Her mammo was normal 8/16. 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, catheters, colostomy bags)   Objective:    BP 102/63 mmHg  Pulse 62  Resp 16  Ht 5\' 8"  (1.727 m)  Wt 206 lb (93.441 kg)  BMI 31.33 kg/m2  General Appearance:    Alert, cooperative, no distress, appears stated age  Head:    Normocephalic, without obvious abnormality, atraumatic  Eyes:    PERRL, conjunctiva/corneas clear, EOM's intact, fundi    benign, both eyes  Ears:    Normal TM's and external ear canals, both ears  Nose:   Nares normal, septum midline, mucosa normal, no drainage    or sinus tenderness  Throat:   Lips, mucosa, and tongue normal; teeth and gums normal  Neck:   Supple, symmetrical, trachea  midline, no adenopathy;    thyroid:  no enlargement/tenderness/nodules; no carotid   bruit or JVD  Back:     Symmetric, no curvature, ROM normal, no CVA tenderness  Lungs:     Clear to auscultation bilaterally, respirations unlabored  Chest Wall:    No tenderness or deformity   Heart:    Regular rate and rhythm, S1 and S2 normal, no murmur, rub   or gallop  Breast Exam:    No tenderness, masses, or nipple abnormality  Abdomen:     Soft, non-tender, bowel sounds active all four quadrants,    no masses, no organomegaly  Genitalia:    Normal female without lesion, discharge or tenderness, Grade 3 cystocele, grade 1 rectocele, grade 2 uterine prolapse Uterus about 6-8 week size, mobile, NT, adnexa not palpable     Extremities:   Extremities normal, atraumatic, no cyanosis or edema  Pulses:   2+ and symmetric all extremities  Skin:   Skin color, texture, turgor normal, no rashes or lesions  Lymph nodes:   Cervical, supraclavicular, and axillary nodes normal  Neurologic:   CNII-XII intact, normal strength, sensation and reflexes    throughout  .    Assessment:    Healthy female exam.   PMB prolapse   Plan:     Breast self exam technique reviewed and patient encouraged to perform self-exam monthly. Pelvic ultrasound. Thin prep Pap  smear.  with cotesting pretreat with cytotec prior to [facility] at next visit

## 2015-07-20 LAB — CYTOLOGY - PAP

## 2015-07-21 ENCOUNTER — Ambulatory Visit (INDEPENDENT_AMBULATORY_CARE_PROVIDER_SITE_OTHER): Payer: Managed Care, Other (non HMO)

## 2015-07-21 DIAGNOSIS — D259 Leiomyoma of uterus, unspecified: Secondary | ICD-10-CM

## 2015-07-21 DIAGNOSIS — N83202 Unspecified ovarian cyst, left side: Secondary | ICD-10-CM

## 2015-07-21 DIAGNOSIS — N950 Postmenopausal bleeding: Secondary | ICD-10-CM

## 2015-08-08 ENCOUNTER — Other Ambulatory Visit: Payer: Managed Care, Other (non HMO) | Admitting: Obstetrics & Gynecology

## 2015-08-15 ENCOUNTER — Ambulatory Visit (INDEPENDENT_AMBULATORY_CARE_PROVIDER_SITE_OTHER): Payer: Managed Care, Other (non HMO) | Admitting: Obstetrics & Gynecology

## 2015-08-15 ENCOUNTER — Encounter: Payer: Self-pay | Admitting: Obstetrics & Gynecology

## 2015-08-15 VITALS — BP 113/67 | HR 75 | Resp 15 | Ht 68.0 in | Wt 211.0 lb

## 2015-08-15 DIAGNOSIS — N950 Postmenopausal bleeding: Secondary | ICD-10-CM | POA: Diagnosis not present

## 2015-08-15 DIAGNOSIS — N83202 Unspecified ovarian cyst, left side: Secondary | ICD-10-CM

## 2015-08-15 NOTE — Progress Notes (Signed)
   Subjective:    Patient ID: Alexis Reyes, female    DOB: 1956-01-09, 60 y.o.   MRN: [id number]  HPI  60 yo lady here for her EMBX due to PMB. Her u/s also showed a small fibroid and a 4.3 simple left ovarian cyst  Review of Systems     Objective:   Physical Exam  [id number] Breathing, conversing, and ambulating normally  UPT negative, consent signed, time out done Cervix prepped with betadine and grasped with a single tooth tenaculum Uterus sounded to 9 cm Pipelle used for 2 passes with a small amount of tissue obtained. A gritty sensation was appreciated. She tolerated the procedure well.      Assessment & Plan:  Left ovarian cyst- check CA-125 and repeat u/s in 6 weeks PMB- await pathology results

## 2015-08-16 ENCOUNTER — Telehealth: Payer: Self-pay | Admitting: *Deleted

## 2015-08-16 LAB — CA 125: CA 125: 4 U/mL (ref <35)

## 2015-08-16 NOTE — Telephone Encounter (Signed)
Pt notified of normal CA-125 of 4.  Will mail patient a copy of report.

## 2015-08-21 ENCOUNTER — Telehealth: Payer: Self-pay | Admitting: *Deleted

## 2015-08-21 NOTE — Telephone Encounter (Signed)
Copy of surgical pathology and CA-125 mailed to pt's ome address per pt request.  She states that she doesn't want to foolk with my-chart.

## 2015-09-26 ENCOUNTER — Ambulatory Visit (INDEPENDENT_AMBULATORY_CARE_PROVIDER_SITE_OTHER): Payer: Managed Care, Other (non HMO)

## 2015-09-26 ENCOUNTER — Encounter (INDEPENDENT_AMBULATORY_CARE_PROVIDER_SITE_OTHER): Payer: Self-pay

## 2015-09-26 DIAGNOSIS — D252 Subserosal leiomyoma of uterus: Secondary | ICD-10-CM

## 2015-09-26 DIAGNOSIS — N83202 Unspecified ovarian cyst, left side: Secondary | ICD-10-CM | POA: Diagnosis not present

## 2015-09-26 DIAGNOSIS — N800 Endometriosis of uterus: Secondary | ICD-10-CM

## 2015-10-18 ENCOUNTER — Ambulatory Visit: Payer: Managed Care, Other (non HMO) | Admitting: Obstetrics & Gynecology

## 2015-12-06 ENCOUNTER — Ambulatory Visit (INDEPENDENT_AMBULATORY_CARE_PROVIDER_SITE_OTHER): Payer: Managed Care, Other (non HMO) | Admitting: Family Medicine

## 2015-12-06 ENCOUNTER — Encounter: Payer: Self-pay | Admitting: Family Medicine

## 2015-12-06 VITALS — BP 115/64 | HR 76

## 2015-12-06 DIAGNOSIS — R300 Dysuria: Secondary | ICD-10-CM | POA: Diagnosis not present

## 2015-12-06 DIAGNOSIS — N3000 Acute cystitis without hematuria: Secondary | ICD-10-CM | POA: Diagnosis not present

## 2015-12-06 LAB — POCT URINALYSIS DIPSTICK
Bilirubin, UA: NEGATIVE
Glucose, UA: NEGATIVE
Ketones, UA: NEGATIVE
Leukocytes, UA: NEGATIVE
Nitrite, UA: POSITIVE
Protein, UA: NEGATIVE
Spec Grav, UA: 1.015
Urobilinogen, UA: 1
pH, UA: 6

## 2015-12-06 MED ORDER — FLUCONAZOLE 150 MG PO TABS: 150.0000 mg | ORAL_TABLET | ORAL | 0 refills | Status: DC

## 2015-12-06 MED ORDER — NITROFURANTOIN MONOHYD MACRO 100 MG PO CAPS: 100.0000 mg | ORAL_CAPSULE | Freq: Two times a day (BID) | ORAL | 0 refills | Status: DC

## 2015-12-06 NOTE — Patient Instructions (Addendum)

## 2015-12-06 NOTE — Progress Notes (Signed)
Subjective:    CC: Urinary symptoms  HPI: 60 year old otherwise healthy female with a history of cystocele comes in today complaining of 2 days of pain and burning. No fever. No abdominal pain or back pain. She has been taking Azo-Standard. Per our records last UTI was about a year ago. No recent antibiotic use.     Objective:    General: Well Developed, well nourished, and in no acute distress.  Neuro: Alert and oriented x3, extra-ocular muscles intact, sensation grossly intact.  HEENT: Normocephalic, atraumatic  Skin: Warm and dry, no rashes. Cardiac: Regular rate and rhythm, no murmurs rubs or gallops, no lower extremity edema.  Respiratory: Clear to auscultation bilaterally. Not using accessory muscles, speaking in full sentences. Back: no CVA tenderness    Impression and Recommendations:    UTI-symptoms and urinalysis consistent with urinary tract infection. With no allergies will treat with Bactrim. Would recommend that she follow-up in 2 weeks for repeat urinalysis to make sure that the hematuria resolves. Call if not improving. Handout provided.  Microscopic hematuria-recommend repeat urinalysis in about 1-2 weeks after he completes antibiotics just to make sure that it resolves. That done here at our office with a nurse visit or can go downstairs to the lab.

## 2015-12-11 ENCOUNTER — Telehealth: Payer: Self-pay | Admitting: Family Medicine

## 2015-12-11 MED ORDER — SULFAMETHOXAZOLE-TRIMETHOPRIM 800-160 MG PO TABS: 1.0000 | ORAL_TABLET | Freq: Two times a day (BID) | ORAL | 0 refills | Status: DC

## 2015-12-11 NOTE — Telephone Encounter (Signed)
Studies show 7 days is just as good as 10. If not improving then we can call in Bactrim for 3 days. New rx sent.   Beatrice Lecher, MD

## 2015-12-11 NOTE — Telephone Encounter (Signed)
Pt called clinic today stating she understood she was supposed to be on the [facility] Rx for 10 days but she it was only written for 7 days. Her symptoms are not gone. Will route to Provider.

## 2015-12-12 NOTE — Telephone Encounter (Signed)
Pt advised of recommendation and new Rx. No further questions.

## 2016-02-13 ENCOUNTER — Other Ambulatory Visit: Payer: Self-pay | Admitting: Osteopathic Medicine

## 2016-02-13 DIAGNOSIS — Z1231 Encounter for screening mammogram for malignant neoplasm of breast: Secondary | ICD-10-CM

## 2016-02-21 ENCOUNTER — Ambulatory Visit (INDEPENDENT_AMBULATORY_CARE_PROVIDER_SITE_OTHER): Payer: Managed Care, Other (non HMO)

## 2016-02-21 DIAGNOSIS — Z1231 Encounter for screening mammogram for malignant neoplasm of breast: Secondary | ICD-10-CM

## 2016-03-04 ENCOUNTER — Encounter: Payer: Managed Care, Other (non HMO) | Admitting: Osteopathic Medicine

## 2016-03-13 ENCOUNTER — Encounter: Payer: Managed Care, Other (non HMO) | Admitting: Osteopathic Medicine

## 2016-03-22 ENCOUNTER — Encounter: Payer: Managed Care, Other (non HMO) | Admitting: Osteopathic Medicine

## 2016-03-25 ENCOUNTER — Ambulatory Visit (INDEPENDENT_AMBULATORY_CARE_PROVIDER_SITE_OTHER): Payer: Managed Care, Other (non HMO) | Admitting: Osteopathic Medicine

## 2016-03-25 VITALS — BP 108/61 | HR 63 | Wt 217.0 lb

## 2016-03-25 DIAGNOSIS — Z2821 Immunization not carried out because of patient refusal: Secondary | ICD-10-CM | POA: Diagnosis not present

## 2016-03-25 DIAGNOSIS — R229 Localized swelling, mass and lump, unspecified: Secondary | ICD-10-CM

## 2016-03-25 DIAGNOSIS — Z789 Other specified health status: Secondary | ICD-10-CM

## 2016-03-25 DIAGNOSIS — Z87448 Personal history of other diseases of urinary system: Secondary | ICD-10-CM

## 2016-03-25 DIAGNOSIS — Z87440 Personal history of urinary (tract) infections: Secondary | ICD-10-CM

## 2016-03-25 DIAGNOSIS — Z9889 Other specified postprocedural states: Secondary | ICD-10-CM | POA: Diagnosis not present

## 2016-03-25 DIAGNOSIS — Z0000 Encounter for general adult medical examination without abnormal findings: Secondary | ICD-10-CM | POA: Diagnosis not present

## 2016-03-25 DIAGNOSIS — R1011 Right upper quadrant pain: Secondary | ICD-10-CM | POA: Diagnosis not present

## 2016-03-25 DIAGNOSIS — Z9884 Bariatric surgery status: Secondary | ICD-10-CM

## 2016-03-25 LAB — POCT URINALYSIS DIPSTICK
Bilirubin, UA: NEGATIVE
Glucose, UA: NEGATIVE
Ketones, UA: NEGATIVE
LEUKOCYTES UA: NEGATIVE
NITRITE UA: NEGATIVE
PH UA: 5.5
Protein, UA: NEGATIVE
Spec Grav, UA: 1.015
Urobilinogen, UA: 0.2

## 2016-03-25 NOTE — Progress Notes (Signed)
HPI: Alexis Reyes is a 60 y.o. female  who presents to [facility] today, 03/25/16,  for chief complaint of:  Chief Complaint  Patient presents with  . Establish Care    Patient here to transfer care from previous PCP who has left the practice. Requests annual physical and has a few other concerns to address.  Recent UTI: Patient requests urine be checked. Reports history of hematuria in most urine samples. Currently no dysuria  Right rib pain/subcutaneous mass: Recent URI infection/cough. She feels a knot under the skin there and on left upper thigh.  Some questions about previous imaging results. Once no if the lump that she feels on her torso was visualized previous abdominal ultrasound. Has some concerns about gallbladder/RUQ pain.     Past medical, surgical, social and family history reviewed: Patient Active Problem List   Diagnosis Date Noted  . 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 Cystocele, grade 3 07/18/2015  . Second degree uterine prolapse 07/18/2015  . PMB (postmenopausal bleeding) 07/18/2015  . Stress incontinence 07/18/2015  . Mild atherosclerosis of carotid artery 12/14/2014  . 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 Hyperglycemia 05/18/2014  . Iron deficiency 05/17/2014  . Hearing loss 05/17/2014   Past Surgical History:  Procedure Laterality Date  . GASTRIC BYPASS  10/2005  . TUBAL LIGATION  12/22/1983   Social History  Substance Use Topics  . Smoking status: Never Smoker  . Smokeless tobacco: Never Used  . Alcohol use 0.6 oz/week    1 Standard drinks or equivalent per week   Family History  Problem Relation Age of Onset  . Diabetes Mother     father   . Stroke Father      Current medication list and allergy/intolerance information reviewed:   Current Outpatient Prescriptions  Medication Sig Dispense Refill  . aspirin EC 81 MG tablet Take 1 tablet (81 mg total) by mouth daily.    . Calcium-Vitamin D-Vitamin K [id number]  MG-UNT-MCG CHEW Chew by mouth.    . Multiple Vitamins-Minerals (MULTIVITAMIN ADULT PO) Take by mouth.    . vitamin B-12 (CYANOCOBALAMIN) 1000 MCG tablet Take 1,000 mcg by mouth.     No current facility-administered medications for this visit.    No Known Allergies    Review of Systems:  Constitutional:  No  fever, no chills, +recent illness  HEENT: No  headache, no vision change, no hearing change, No sore throat, No  sinus pressure  Cardiac: No  chest pain, No  pressure, No palpitations, No  Orthopnea  Respiratory:  No  shortness of breath. No  Cough  Gastrointestinal: +abdominal pain RUQ, No  nausea, No  vomiting,  No  blood in stool, No  diarrhea, No  constipation   Musculoskeletal: No new myalgia/arthralgia  Genitourinary: No  incontinence, No dysuria  Skin: No  Rash  Hem/Onc: No  easy bruising/bleeding, No  abnormal lymph node, +unusual lump/bump  Neurologic: No  weakness, No  dizziness  Psychiatric: No  concerns with depression, No  concerns with anxiety  Exam:  BP 108/61   Pulse 63   Wt 217 lb (98.4 kg)   BMI 32.99 kg/m   Constitutional: VS see above. General Appearance: alert, well-developed, well-nourished, NAD  Eyes: Normal lids and conjunctive, non-icteric sclera  Ears, Nose, Mouth, Throat: MMM, Normal external inspection ears/nares/mouth/lips/gums. TM normal bilaterally. Pharynx/tonsils no erythema, no exudate. Nasal mucosa normal.   Neck: No masses, trachea midline. No thyroid enlargement. No tenderness/mass appreciated. No lymphadenopathy  Respiratory: Normal respiratory effort.  no wheeze, no rhonchi, no rales  Cardiovascular: S1/S2 normal, no murmur, no rub/gallop auscultated. RRR. No lower extremity edema.   Gastrointestinal: +RUQ tenderness, no masses. No hepatomegaly, no splenomegaly. No hernia appreciated. Bowel sounds normal. Rectal exam deferred.   Musculoskeletal: Gait normal. No clubbing/cyanosis of digits.   Neurological: Normal  balance/coordination. No tremor. No cranial nerve deficit on limited exam. Motor and sensation intact and symmetric. Cerebellar reflexes intact.   Skin/Subq: warm, dry, intact. No rash/ulcer. No concerning nevi or subq nodules on limited exam.  Barely palpable right flank/torso subcutaneous mass consistent with lipoma but is a bit deep to only characterize by palpation, similar on upper anterior thigh.   Psychiatric: Normal judgment/insight. Normal mood and affect. Oriented x3.    No results found for this or any previous visit (from the past 72 hour(s)).  No results found.   ASSESSMENT/PLAN:   Annual physical exam - See below for review of preventive care. Due for annual labs for routine screening and monitoring of status post gastric bypass - Plan: CBC with Differential/Platelet, COMPLETE METABOLIC PANEL WITH GFR, Lipid panel, TSH  Subcutaneous mass - Advised that ultrasound may be able to characterize these further but may also require MRI, patient would like ultrasound - Plan: [place] Misc Soft Tissue  History of gastric bypass - Plan: CBC with Differential/Platelet, COMPLETE METABOLIC PANEL WITH GFR, Lipid panel, TSH, Ferritin, Folate, Iron and TIBC, VITAMIN D 25 Hydroxy (Vit-D Deficiency, Fractures), Vitamin B1, Vitamin B12, Zinc, Parathyroid hormone, intact (no Ca), Copper, serum  History of hematuria - We'll sent for microscopy - Plan: Urinalysis, microscopic only, POCT Urinalysis Dipstick  History of UTI - Patient requests recheck urine, no leukocytes, positive blood - Plan: Urinalysis, microscopic only, POCT Urinalysis Dipstick  RUQ pain - Abdominal ultrasound ordered - Plan: US Abdomen Limited  Influenza vaccination declined  Unknown tetanus toxoid immunization status - Patient will check records, declines vaccine today.    FEMALE PREVENTIVE CARE Updated 03/25/16   ANNUAL SCREENING/COUNSELING  Diet/Exercise - HEALTHY HABITS DISCUSSED TO DECREASE CV RISK History  Smoking  Status  . Never Smoker  Smokeless Tobacco  . Never Used   History  Alcohol Use  . 0.6 oz/week  . 1 Standard drinks or equivalent per week   No flowsheet data found.  Domestic violence concerns - no  HTN SCREENING - SEE [place]  Sexually active in the past year - Yes with female.  Need/want STI testing today? - no  Concerns about libido or pain with sex? - no  Plans for pregnancy? - s/p menopause  INFECTIOUS DISEASE SCREENING  HIV - needs  GC/CT - does not need  HepC - DOB 1945[birth year]1965 - needs  TB - does not need  DISEASE SCREENING  Lipid - needs  DM2 - needs  Osteoporosis - women age 65+ - does not need  Fracture after age 68? no  Chronic steroid use? no  Smoking/Alcohol? no  Low body weight <127lb? no  Hip fracture in parent? no  Premature menopause, malabsorbtion, CLD, IBD, RA? no  CANCER SCREENING  Cervical - does not need  Breast - does not need  Lung - does not need  Colon - does not need - normal colonoscopy 22015, pt instructed to f/u in 5 years  ADULT VACCINATION  Influenza - annual vaccine recommended  Td - booster every 10 years   Zoster - option at 50, yes at 60+   PCV13 - was not indicated  PPSV23 - was not  indicated  There is no immunization history on file for this patient.      Visit summary with medication list and pertinent instructions was printed for patient to review. All questions at time of visit were answered - patient instructed to contact office with any additional concerns. ER/RTC precautions were reviewed with the patient. Follow-up plan: Return in about 1 year (around 03/25/2017) for [place], sooner if needed.   Note: 99214 billed separately for new conditions of subcutaneous mass, recent UTI, RUQ pain, chronic condition review gastric bypass history

## 2016-03-26 DIAGNOSIS — Z87448 Personal history of other diseases of urinary system: Secondary | ICD-10-CM | POA: Insufficient documentation

## 2016-03-26 DIAGNOSIS — R229 Localized swelling, mass and lump, unspecified: Secondary | ICD-10-CM | POA: Insufficient documentation

## 2016-03-26 DIAGNOSIS — Z9884 Bariatric surgery status: Secondary | ICD-10-CM | POA: Insufficient documentation

## 2016-03-26 DIAGNOSIS — Z87440 Personal history of urinary (tract) infections: Secondary | ICD-10-CM | POA: Insufficient documentation

## 2016-03-30 ENCOUNTER — Ambulatory Visit (HOSPITAL_BASED_OUTPATIENT_CLINIC_OR_DEPARTMENT_OTHER): Admission: RE | Admit: 2016-03-30 | Payer: Managed Care, Other (non HMO) | Source: Ambulatory Visit

## 2016-04-01 ENCOUNTER — Other Ambulatory Visit (HOSPITAL_BASED_OUTPATIENT_CLINIC_OR_DEPARTMENT_OTHER): Payer: Self-pay | Admitting: Dentistry

## 2016-04-02 ENCOUNTER — Other Ambulatory Visit: Payer: Self-pay | Admitting: Osteopathic Medicine

## 2016-04-02 DIAGNOSIS — R1011 Right upper quadrant pain: Secondary | ICD-10-CM

## 2016-04-02 DIAGNOSIS — R229 Localized swelling, mass and lump, unspecified: Secondary | ICD-10-CM

## 2016-04-06 ENCOUNTER — Other Ambulatory Visit (HOSPITAL_BASED_OUTPATIENT_CLINIC_OR_DEPARTMENT_OTHER): Payer: Managed Care, Other (non HMO)

## 2016-04-06 ENCOUNTER — Ambulatory Visit (HOSPITAL_BASED_OUTPATIENT_CLINIC_OR_DEPARTMENT_OTHER)
Admission: RE | Admit: 2016-04-06 | Discharge: 2016-04-06 | Disposition: A | Discharge status: Home / Self Care | Payer: Managed Care, Other (non HMO) | Source: Ambulatory Visit | Attending: Osteopathic Medicine | Admitting: Osteopathic Medicine

## 2016-04-06 DIAGNOSIS — R1011 Right upper quadrant pain: Secondary | ICD-10-CM

## 2016-04-06 DIAGNOSIS — R229 Localized swelling, mass and lump, unspecified: Secondary | ICD-10-CM

## 2016-04-06 DIAGNOSIS — R2242 Localized swelling, mass and lump, left lower limb: Secondary | ICD-10-CM | POA: Diagnosis not present

## 2016-04-06 DIAGNOSIS — K8020 Calculus of gallbladder without cholecystitis without obstruction: Secondary | ICD-10-CM | POA: Insufficient documentation

## 2016-04-08 LAB — CBC WITH DIFFERENTIAL/PLATELET
BASOS ABS: 71 {cells}/uL (ref 0–200)
Basophils Relative: 1 %
Eosinophils Absolute: 142 cells/uL (ref 15–500)
Eosinophils Relative: 2 %
HCT: 43.9 % (ref 35.0–45.0)
HEMOGLOBIN: 13.9 g/dL (ref 11.7–15.5)
Lymphocytes Relative: 30 %
Lymphs Abs: 2130 cells/uL (ref 850–3900)
MCH: 27.2 pg (ref 27.0–33.0)
MCHC: 31.7 g/dL — ABNORMAL LOW (ref 32.0–36.0)
MCV: 85.9 fL (ref 80.0–100.0)
MONOS PCT: 8 %
MPV: 9.4 fL (ref 7.5–12.5)
Monocytes Absolute: 568 cells/uL (ref 200–950)
Neutro Abs: 4189 cells/uL (ref 1500–7800)
Neutrophils Relative %: 59 %
PLATELETS: 280 10*3/uL (ref 140–400)
RBC: 5.11 MIL/uL — ABNORMAL HIGH (ref 3.80–5.10)
RDW: 14.1 % (ref 11.0–15.0)
WBC: 7.1 10*3/uL (ref 3.8–10.8)

## 2016-04-09 LAB — COMPLETE METABOLIC PANEL WITH GFR
ALK PHOS: 75 U/L (ref 33–130)
ALT: 12 U/L (ref 6–29)
AST: 16 U/L (ref 10–35)
Albumin: 3.9 g/dL (ref 3.6–5.1)
BILIRUBIN TOTAL: 0.5 mg/dL (ref 0.2–1.2)
BUN: 11 mg/dL (ref 7–25)
CO2: 30 mmol/L (ref 20–31)
CREATININE: 0.79 mg/dL (ref 0.50–1.05)
Calcium: 9.5 mg/dL (ref 8.6–10.4)
Chloride: 106 mmol/L (ref 98–110)
GFR, EST NON AFRICAN AMERICAN: 82 mL/min (ref >=60)
Glucose, Bld: 81 mg/dL (ref 65–99)
Potassium: 4.6 mmol/L (ref 3.5–5.3)
Sodium: 143 mmol/L (ref 135–146)
TOTAL PROTEIN: 6.8 g/dL (ref 6.1–8.1)

## 2016-04-09 LAB — URINALYSIS, MICROSCOPIC ONLY
BACTERIA UA: NONE SEEN [HPF]
Casts: NONE SEEN [LPF]
Crystals: NONE SEEN [HPF]
SQUAMOUS EPITHELIAL / LPF: NONE SEEN [HPF] (ref <=5)
WBC UA: NONE SEEN WBC/HPF (ref <=5)
Yeast: NONE SEEN [HPF]

## 2016-04-09 LAB — IRON AND TIBC
%SAT: 24 % (ref 11–50)
IRON: 76 ug/dL (ref 45–160)
TIBC: 311 ug/dL (ref 250–450)
UIBC: 235 ug/dL (ref 125–400)

## 2016-04-09 LAB — FERRITIN: Ferritin: 52 ng/mL (ref 10–232)

## 2016-04-09 LAB — TSH: TSH: 1.71 mIU/L

## 2016-04-09 LAB — FOLATE: FOLATE: 13.2 ng/mL (ref >5.4)

## 2016-04-09 LAB — LIPID PANEL
Cholesterol: 180 mg/dL (ref <200)
HDL: 42 mg/dL — AB (ref >50)
LDL Cholesterol: 115 mg/dL — ABNORMAL HIGH (ref <100)
TRIGLYCERIDES: 114 mg/dL (ref <150)
Total CHOL/HDL Ratio: 4.3 Ratio (ref <5.0)
VLDL: 23 mg/dL (ref <30)

## 2016-04-09 LAB — PARATHYROID HORMONE, INTACT (NO CA): PTH: 41 pg/mL (ref 14–64)

## 2016-04-09 LAB — VITAMIN D 25 HYDROXY (VIT D DEFICIENCY, FRACTURES): VIT D 25 HYDROXY: 32 ng/mL (ref 30–100)

## 2016-04-11 LAB — ZINC: Zinc: 62 ug/dL (ref 60–130)

## 2016-04-12 LAB — VITAMIN B1: Vitamin B1 (Thiamine): 14 nmol/L (ref 8–30)

## 2016-05-02 ENCOUNTER — Encounter: Payer: Self-pay | Admitting: Family Medicine

## 2016-05-02 ENCOUNTER — Ambulatory Visit (INDEPENDENT_AMBULATORY_CARE_PROVIDER_SITE_OTHER): Payer: Managed Care, Other (non HMO) | Admitting: Family Medicine

## 2016-05-02 VITALS — BP 116/73 | HR 98 | Temp 98.2°F

## 2016-05-02 DIAGNOSIS — J069 Acute upper respiratory infection, unspecified: Secondary | ICD-10-CM | POA: Diagnosis not present

## 2016-05-02 DIAGNOSIS — B9789 Other viral agents as the cause of diseases classified elsewhere: Secondary | ICD-10-CM | POA: Diagnosis not present

## 2016-05-02 MED ORDER — PREDNISONE 10 MG PO TABS: 30.0000 mg | ORAL_TABLET | Freq: Every day | ORAL | 0 refills | Status: DC

## 2016-05-02 MED ORDER — IPRATROPIUM BROMIDE 0.06 % NA SOLN: 2.0000 | NASAL | 6 refills | Status: DC | PRN

## 2016-05-02 MED ORDER — FLUCONAZOLE 150 MG PO TABS: 150.0000 mg | ORAL_TABLET | Freq: Once | ORAL | 1 refills | Status: AC

## 2016-05-02 MED ORDER — AZITHROMYCIN 250 MG PO TABS: 250.0000 mg | ORAL_TABLET | Freq: Every day | ORAL | 0 refills | Status: DC

## 2016-05-02 NOTE — Patient Instructions (Addendum)
Thank you for coming in today. Call or go to the emergency room if you get worse, have trouble breathing, have chest pains, or palpitations.   Use the nasal spray.  Use the azithromycin and prednisone if not better.  Both of these medicines have side effects we would like to avoid so please do not take them unless you get much worse.    We will keep an eye on those gall stones. We do not know if they are causing your pain.  If you would like we can do further testing or refer to surgery.      Upper Respiratory Infection, Adult Most upper respiratory infections (URIs) are caused by a virus. A URI affects the nose, throat, and upper air passages. The most common type of URI is often called "the common cold." Follow these instructions at home:  Take medicines only as told by your doctor.  Gargle warm saltwater or take cough drops to comfort your throat as told by your doctor.  Use a warm mist humidifier or inhale steam from a shower to increase air moisture. This may make it easier to breathe.  Drink enough fluid to keep your pee (urine) clear or pale yellow.  Eat soups and other clear broths.  Have a healthy diet.  Rest as needed.  Go back to work when your fever is gone or your doctor says it is okay.  You may need to stay home longer to avoid giving your URI to others.  You can also wear a face mask and wash your hands often to prevent spread of the virus.  Use your inhaler more if you have asthma.  Do not use any tobacco products, including cigarettes, chewing tobacco, or electronic cigarettes. If you need help quitting, ask your doctor. Contact a doctor if:  You are getting worse, not better.  Your symptoms are not helped by medicine.  You have chills.  You are getting more short of breath.  You have brown or red mucus.  You have yellow or brown discharge from your nose.  You have pain in your face, especially when you bend forward.  You have a fever.  You  have puffy (swollen) neck glands.  You have pain while swallowing.  You have white areas in the back of your throat. Get help right away if:  You have very bad or constant:  Headache.  Ear pain.  Pain in your forehead, behind your eyes, and over your cheekbones (sinus pain).  Chest pain.  You have long-lasting (chronic) lung disease and any of the following:  Wheezing.  Long-lasting cough.  Coughing up blood.  A change in your usual mucus.  You have a stiff neck.  You have changes in your:  Vision.  Hearing.  Thinking.  Mood. This information is not intended to replace advice given to you by your health care provider. Make sure you discuss any questions you have with your health care provider. Document Released: 09/25/2007 Document Revised: 12/10/2015 Document Reviewed: 07/14/2013 Elsevier Interactive Patient Education  2017 [employer].

## 2016-05-02 NOTE — Progress Notes (Signed)
       Alexis Reyes is a 61 y.o. female who presents to [facility] today for one week of chills sore throat and runny nose body aches mild cough and scratchy throat. She denies fevers or vomiting or diarrhea. She denies trouble breathing. She has tried over-the-counter medications which have helped a bit.   Past Medical History:  Diagnosis Date  . Abnormal Pap smear of cervix 2006   colpo  . Hearing loss 05/17/2014  . Iron deficiency 05/17/2014   Past Surgical History:  Procedure Laterality Date  . GASTRIC BYPASS  10/2005  . TUBAL LIGATION  12/22/1983   Social History  Substance Use Topics  . Smoking status: Never Smoker  . Smokeless tobacco: Never Used  . Alcohol use 0.6 oz/week    1 Standard drinks or equivalent per week   family history includes Diabetes in her mother; Stroke in her father.  ROS as above:  Medications: Current Outpatient Prescriptions  Medication Sig Dispense Refill  . aspirin EC 81 MG tablet Take 1 tablet (81 mg total) by mouth daily.    . Calcium-Vitamin D-Vitamin K [id number] MG-UNT-MCG CHEW Chew by mouth.    . Multiple Vitamins-Minerals (MULTIVITAMIN ADULT PO) Take by mouth.    . vitamin B-12 (CYANOCOBALAMIN) 1000 MCG tablet Take 1,000 mcg by mouth.    Marland Kitchen azithromycin (ZITHROMAX) 250 MG tablet Take 1 tablet (250 mg total) by mouth daily. Take first 2 tablets together, then 1 every day until finished. 6 tablet 0  . fluconazole (DIFLUCAN) 150 MG tablet Take 1 tablet (150 mg total) by mouth once. 1 tablet 1  . ipratropium (ATROVENT) 0.06 % nasal spray Place 2 sprays into both nostrils every 4 (four) hours as needed for rhinitis. 10 mL 6  . predniSONE (DELTASONE) 10 MG tablet Take 3 tablets (30 mg total) by mouth daily with breakfast. 15 tablet 0   No current facility-administered medications for this visit.    No Known Allergies  Health  Maintenance Health Maintenance  Topic Date Due  . Hepatitis C Screening  13-Jul-1955  . HIV Screening  03/14/1972  . INFLUENZA VACCINE  11/21/2015  . TETANUS/TDAP  04/22/2018 (Originally 03/14/1976)  . MAMMOGRAM  02/20/2018  . PAP SMEAR  07/17/2020  . COLONOSCOPY  05/19/2023     Exam:  BP 116/73   Pulse 98   Temp 98.2 F (36.8 C) (Oral)  Gen: Well NAD HEENT: EOMI,  MMM or nasal discharge. Normal tympanic membranes bilaterally. Minimal cervical lymphadenopathy present bilaterally.  posterior pharynx with cobblestoning Lungs: Normal work of breathing. CTABL Heart: RRR no MRG Abd: NABS, Soft. Nondistended, Nontender Exts: Brisk capillary refill, warm and well perfused.    No results found for this or any previous visit (from the past 72 hour(s)). No results found.    Assessment and Plan: 61 y.o. female with viral URI. Symptomatic management with over-the-counter medications along with Atrovent nasal spray. Use prednisone and azithromycin if not better.   No orders of the defined types were placed in this encounter.   Discussed warning signs or symptoms. Please see discharge instructions. Patient expresses understanding.

## 2016-12-16 IMAGING — US US TRANSVAGINAL NON-OB
1 series · 13 of 25 positions shown · non-contrast
Comparison: None in PACs

CLINICAL DATA: Postmenopausal bleeding began on [DATE] and lasted

EXAM:
TRANSABDOMINAL AND TRANSVAGINAL ULTRASOUND OF PELVIS
TECHNIQUE: Both transabdominal and transvaginal ultrasound examinations of the
pelvis were performed. Transabdominal technique was performed for
global imaging of the pelvis including uterus, ovaries, adnexal
regions, and pelvic cul-de-sac. It was necessary to proceed with
endovaginal exam following the transabdominal exam to visualize the
uterus, endometrium, ovaries, and adnexal regions..

[Series 1: us transvaginal non-ob · 0.24mm/px · 13 of 99 slices shown]
[im 1/99]
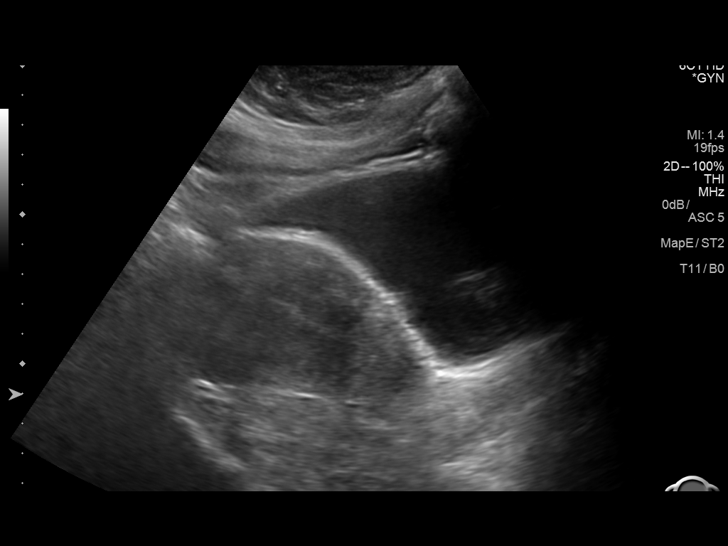
[im 9/99]
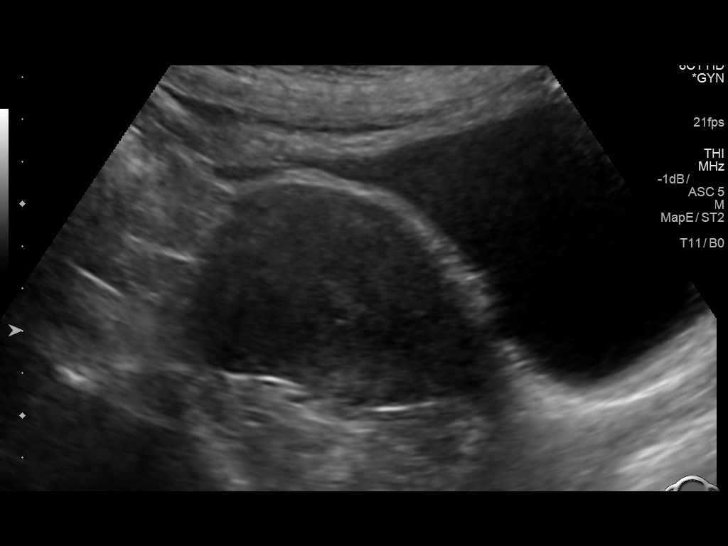
[im 17/99]
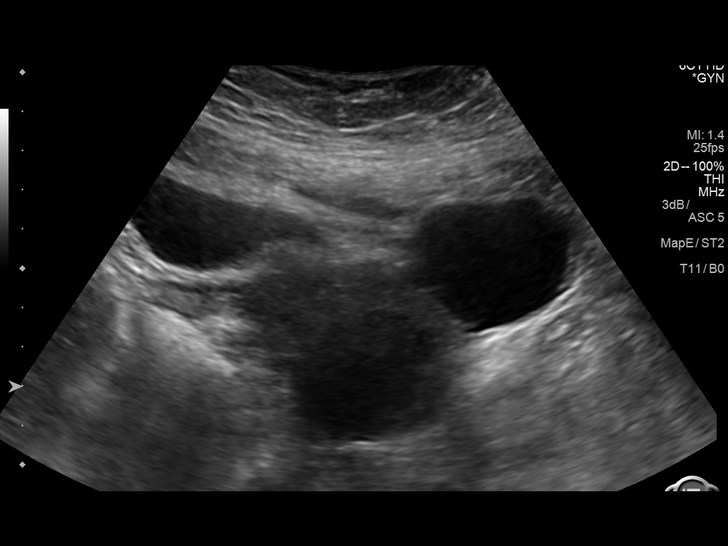
[im 25/99]
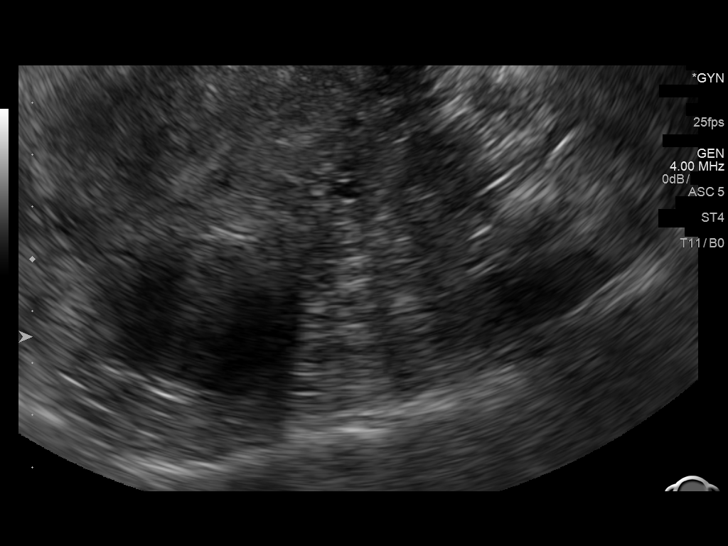
[im 33/99]
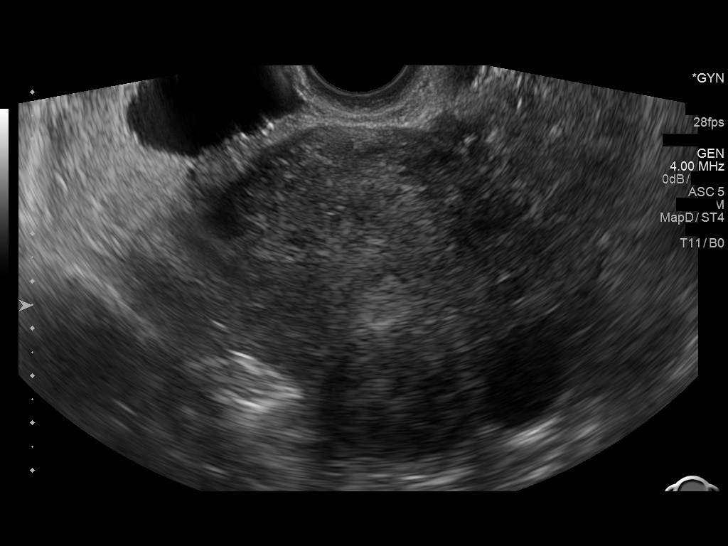
[im 41/99]
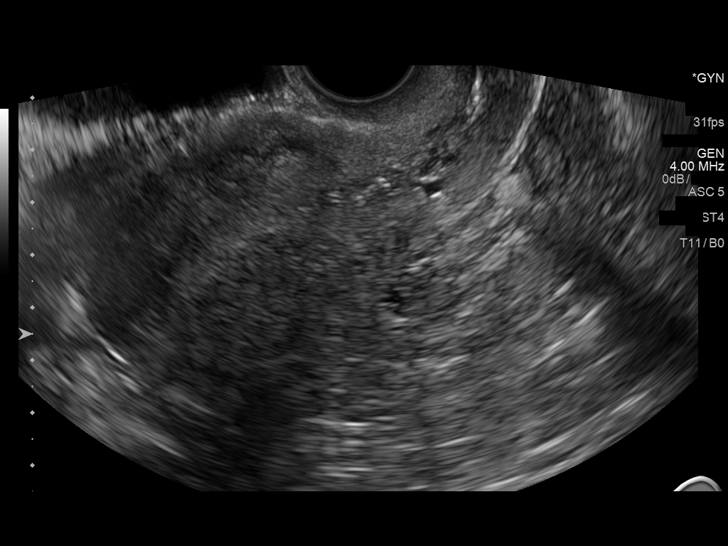
[im 50/99]
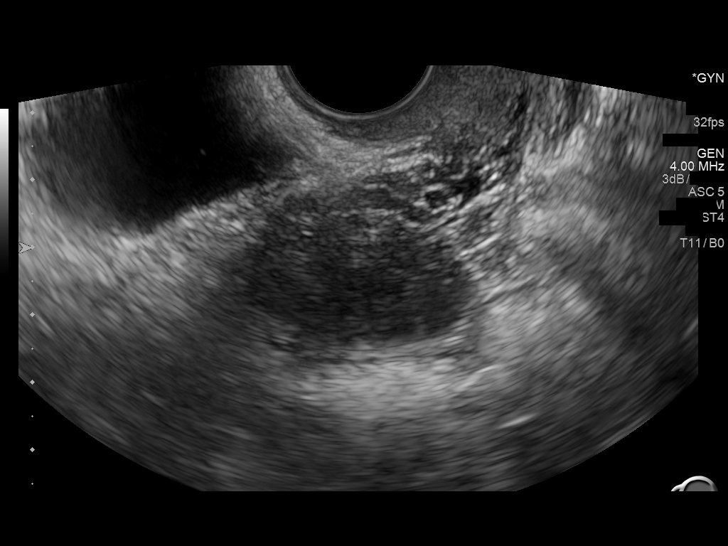
[im 58/99]
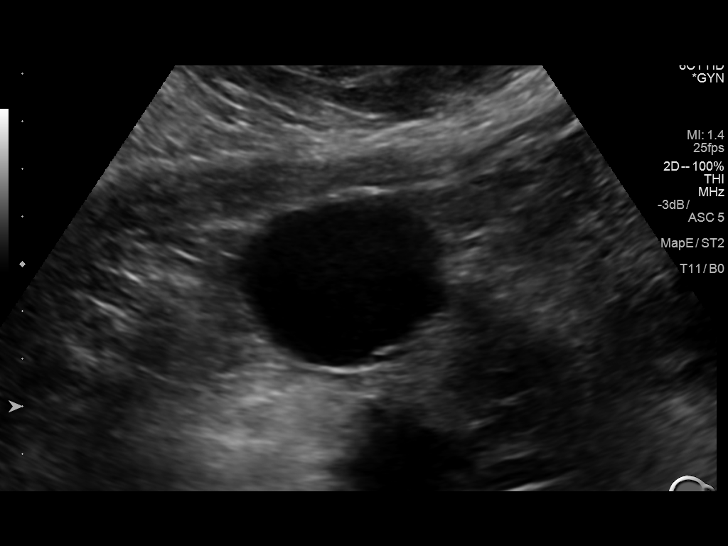
[im 66/99]
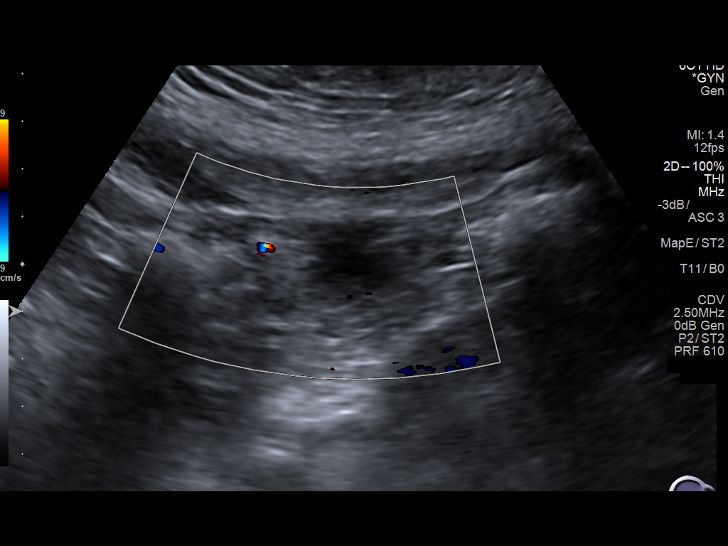
[im 74/99]
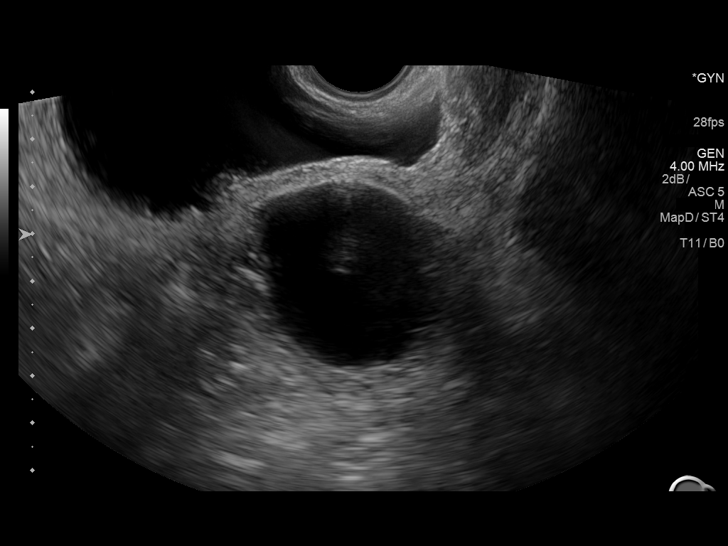
[im 82/99]
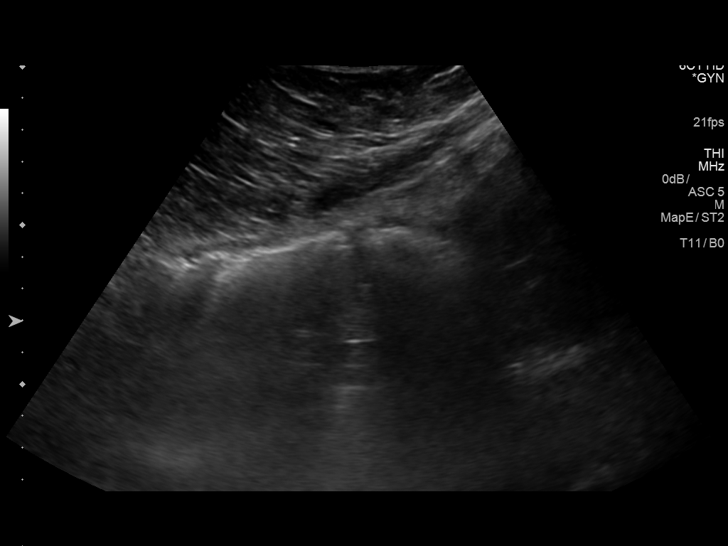
[im 90/99]
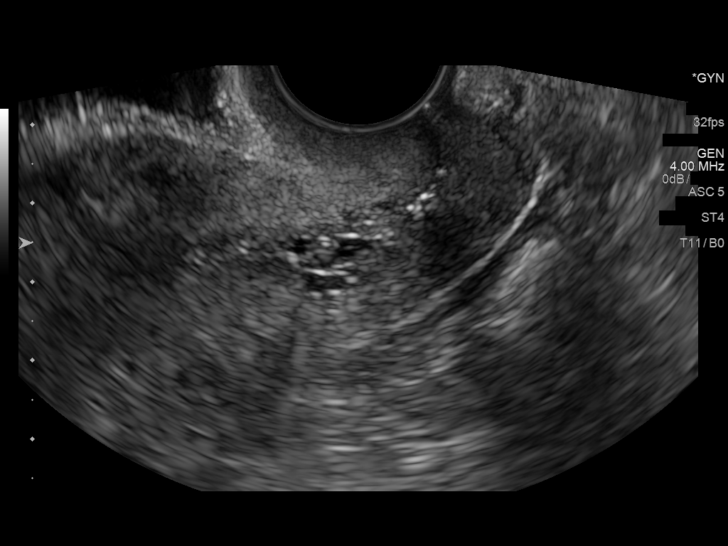
[im 99/99]
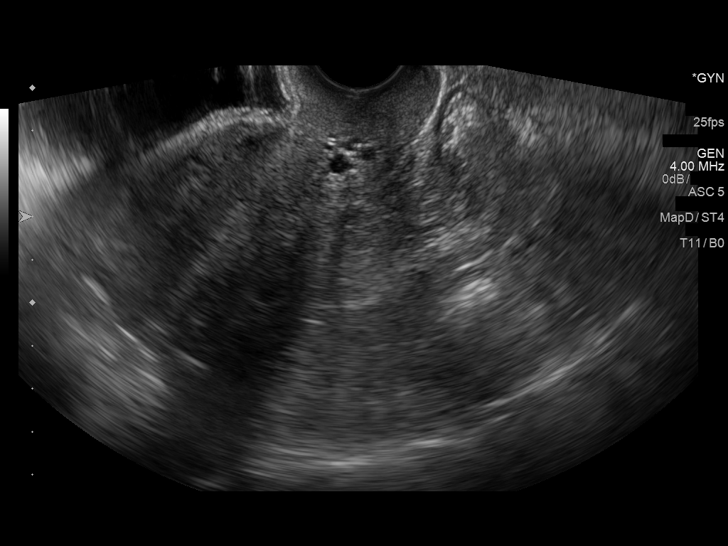

[13 of 25 positions shown; findings below may reference images not displayed]

FINDINGS: Uterus

Measurements: 10.2 x 6.5 x 6.9 cm.. The myometrial echotexture is
heterogeneous. There is a left-sided posterior fundal mass
demonstrating heterogeneous echotexture compatible with a fibroid
measuring 3.6 x 3.1 x 3.3 cm.

Endometrium

Thickness: 7.3 mm.  No focal abnormality visualized.

Right ovary

Measurements: 3.3 x 2.0 x 2.7 cm.. Normal appearance/no adnexal
mass.

Left ovary

Measurements: 6.4 x 5.0 x 6.4 cm. There is a cystic structure
measuring 4.3 x 3.9 x 4.1 cm.

Other findings

There is no free pelvic fluid.
IMPRESSION: 1. Left-sided posterior uterine fibroid measuring 3.6 cm in greatest
dimension.
2. The endometrial stripe measures 7.3 mm. In the setting of
post-menopausal bleeding, endometrial sampling is indicated to
exclude carcinoma. If results are benign, sonohysterogram should be
considered for focal lesion work-up. (Ref: Radiological Reasoning:
Algorithmic Workup of Abnormal Vaginal Bleeding with Endovaginal
Sonography and Sonohysterography. AJR [id number]; 191:S68-73)
3. Left ovarian cystic structure measuring 4.3 cm in greatest
dimension. Follow-up pelvic ultrasound in 6-12 weeks is recommended
to re-evaluate this structure.

## 2017-02-21 IMAGING — US US TRANSVAGINAL NON-OB
1 series · 13 of 25 positions shown · non-contrast
Comparison: 07/21/2015 pelvic sonogram.

CLINICAL DATA: 51-year-old postmenopausal female presents for
follow-up of a left ovarian cystic structure. History of uterine
fibroid.

EXAM:
TRANSABDOMINAL AND TRANSVAGINAL ULTRASOUND OF PELVIS
TECHNIQUE: Both transabdominal and transvaginal ultrasound examinations of the
pelvis were performed. Transabdominal technique was performed for
global imaging of the pelvis including uterus, ovaries, adnexal
regions, and pelvic cul-de-sac. It was necessary to proceed with
endovaginal exam following the transabdominal exam to visualize the
endometrium, myometrium and adnexa.

[Series 1: us transvaginal non-ob · 0.22mm/px · 13 of 81 slices shown]
[im 1/81]
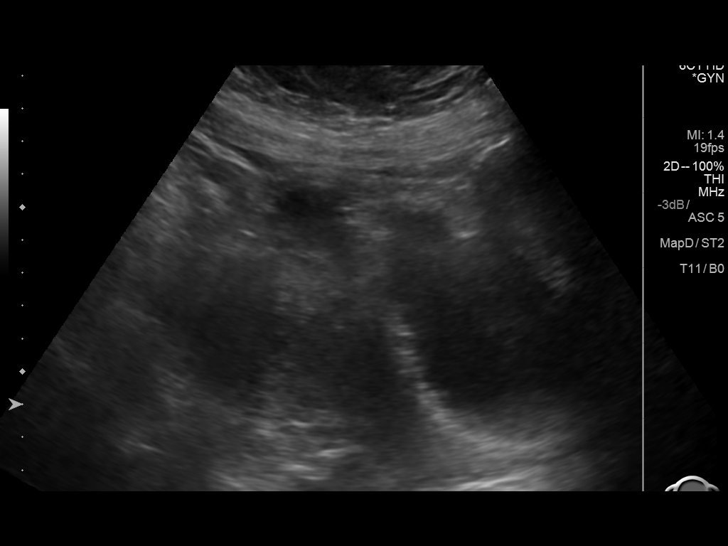
[im 7/81]
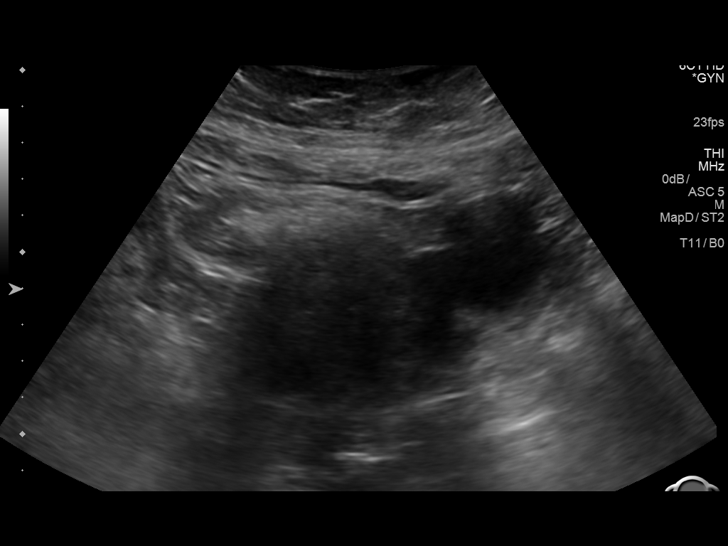
[im 14/81]
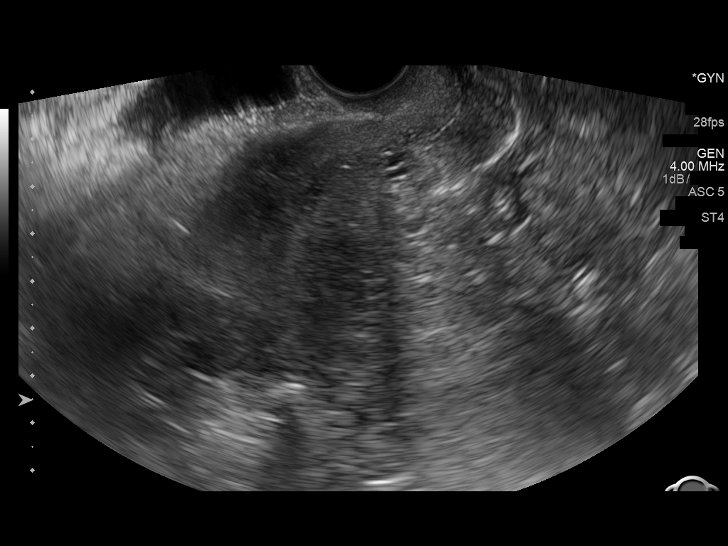
[im 21/81]
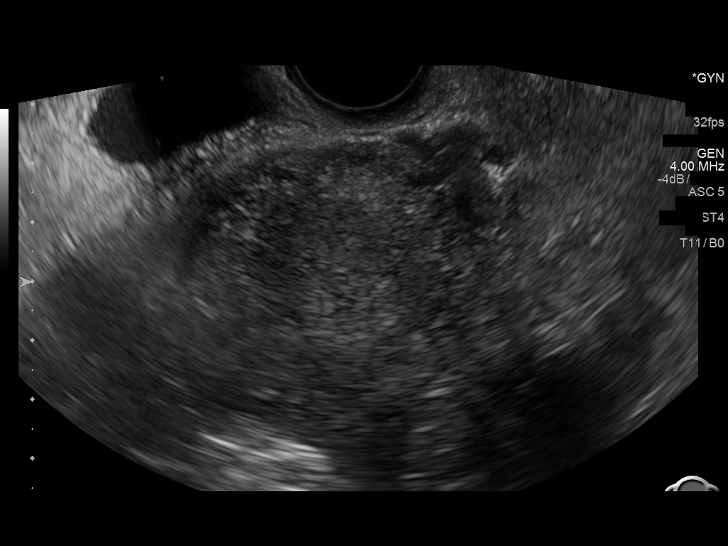
[im 27/81]
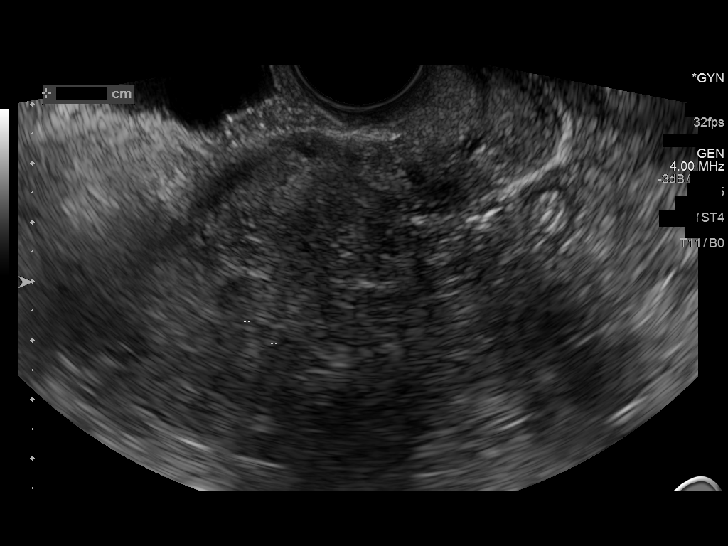
[im 34/81]
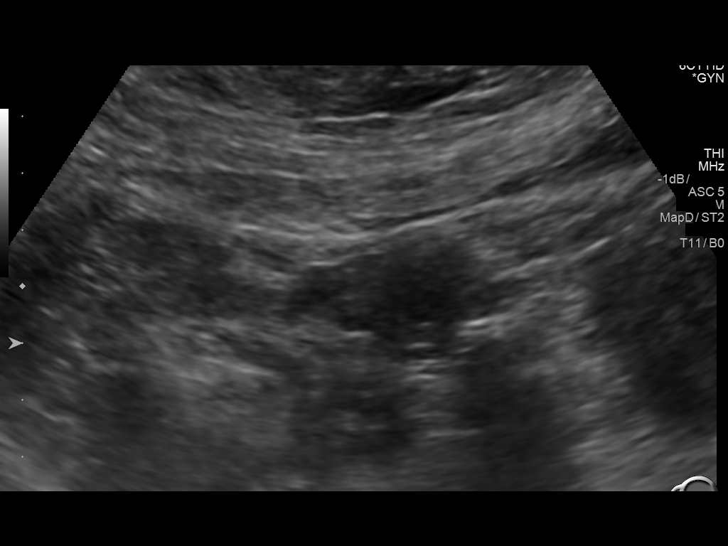
[im 41/81]
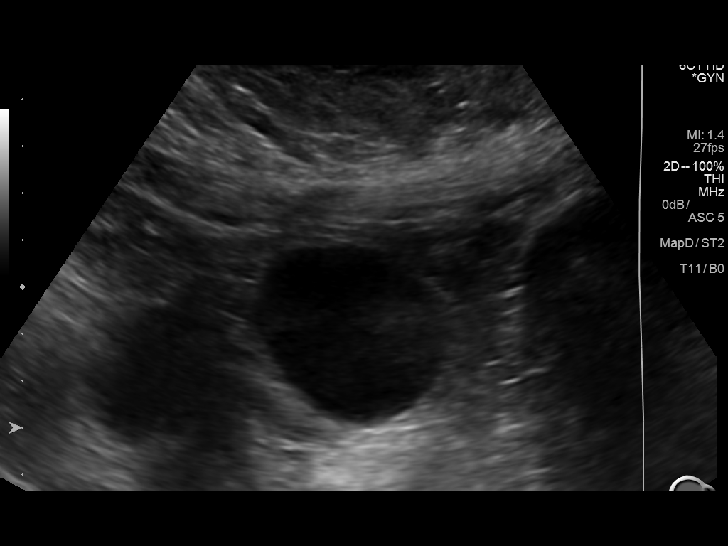
[im 47/81]
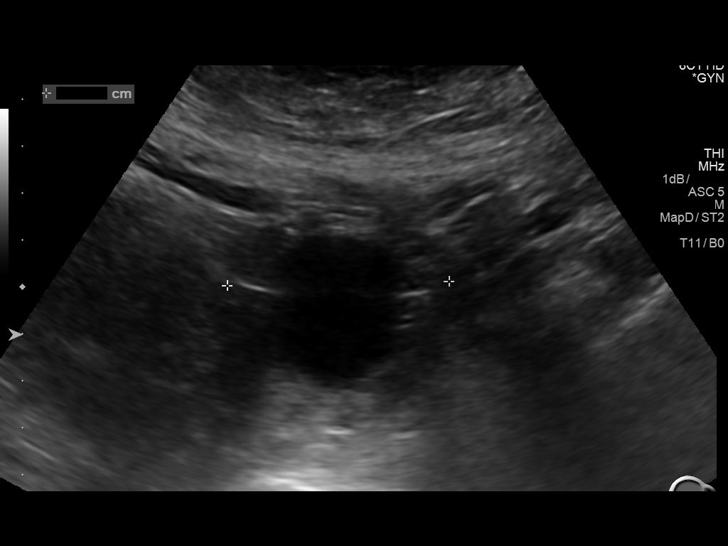
[im 54/81]
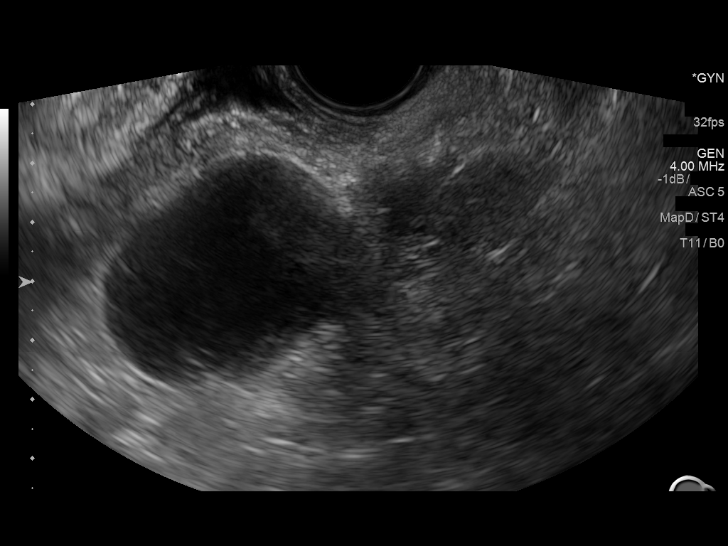
[im 61/81]
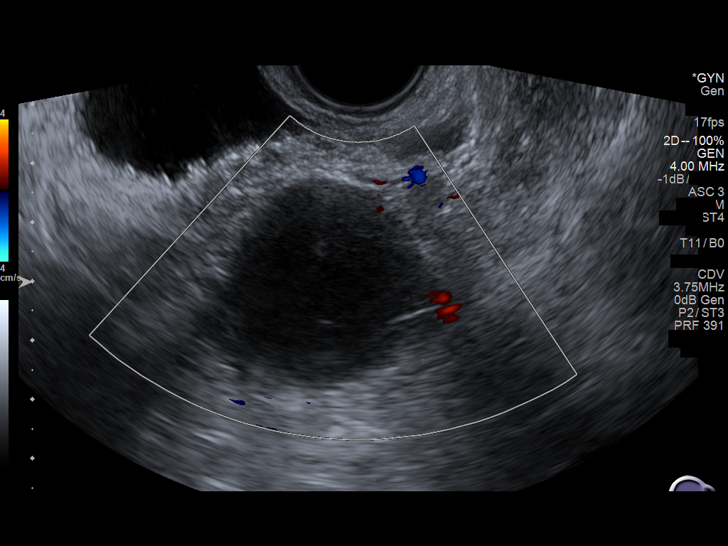
[im 67/81]
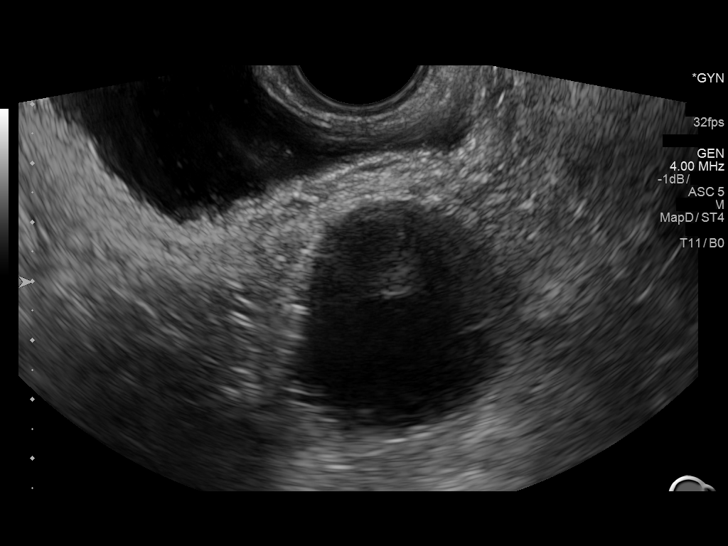
[im 74/81]
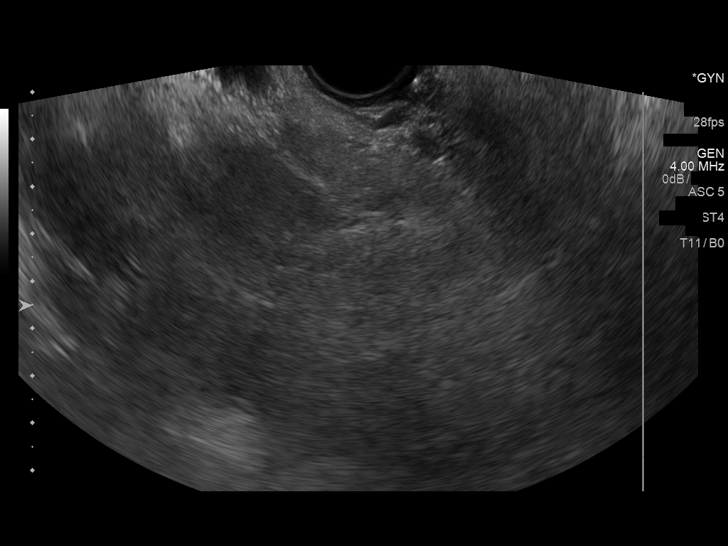
[im 81/81]
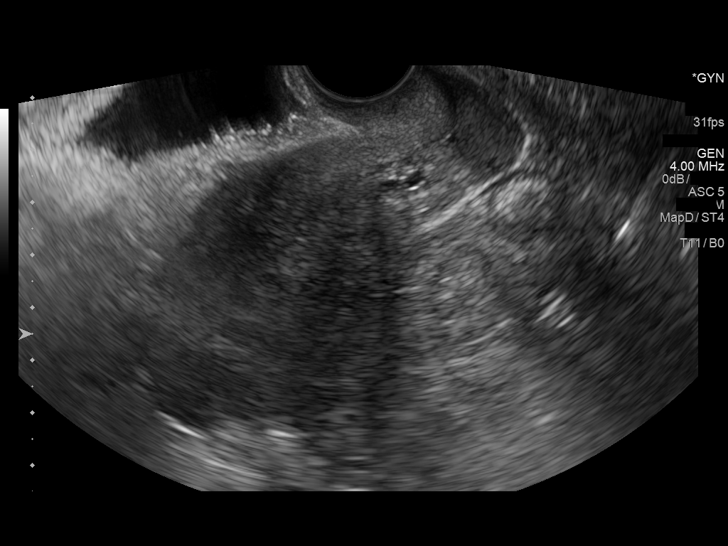

[13 of 25 positions shown; findings below may reference images not displayed]

FINDINGS: Uterus

Measurements: 9.7 x 5.8 x 5.7 cm. The anteverted uterus is mildly
enlarged. There is a solitary subserosal 3.6 x 3.0 x 2.9 cm left
posterior fundal fibroid, previously 3.6 x 3.1 x 3.3 cm, not
appreciably changed. The myometrium is diffusely heterogeneous with
refractory myometrial shadowing, and the endometrial-myometrial
interface is indistinct, indicating diffuse adenomyosis of the
uterus.

Endometrium

Evaluation of the endometrium is considered nondiagnostic due to
completely indistinct endometrial-myometrial interface due to
adenomyosis. No endometrial cavity fluid or focal endometrial mass
demonstrated.

Right ovary

Measurements: 3.2 x 1.8 x 2.8 cm. Normal appearance/no adnexal mass.

Left ovary

Measurements: 5.9 x 4.6 x 4.9 cm. Left ovary contains a simple 4.0 x
4.0 x 3.3 cm cyst, previously 4.3 x 4.0 x 4.1 cm on 07/21/2015, not
appreciably changed. No additional left ovarian or left adnexal
masses.

Other findings

No abnormal free fluid.
IMPRESSION: 1. Stable simple 4.0 cm left ovarian cyst, benign-appearing. No
suspicious ovarian or adnexal masses. No abnormal free fluid in the
pelvis. Follow-up transabdominal / transvaginal pelvic ultrasound is
recommended in 12 months. This recommendation follows the consensus
statement: Management of Asymptomatic Ovarian and Other Adnexal
Cysts Imaged at US: Society of Radiologists in Ultrasound Consensus
2. Diffuse adenomyosis of the uterus. Stable solitary 3.6 cm left
posterior fundal subserosal fibroid.
3. Nondiagnostic evaluation of the endometrium due to diffuse
uterine adenomyosis with completely indistinct
endometrial-myometrial interface.

## 2017-04-07 ENCOUNTER — Other Ambulatory Visit: Payer: Self-pay | Admitting: Obstetrics and Gynecology

## 2017-04-07 DIAGNOSIS — Z1231 Encounter for screening mammogram for malignant neoplasm of breast: Secondary | ICD-10-CM

## 2017-04-21 ENCOUNTER — Other Ambulatory Visit: Payer: Self-pay

## 2017-04-21 ENCOUNTER — Emergency Department (INDEPENDENT_AMBULATORY_CARE_PROVIDER_SITE_OTHER)
Admission: EM | Admit: 2017-04-21 | Discharge: 2017-04-21 | Disposition: A | Discharge status: Home / Self Care | Payer: Managed Care, Other (non HMO) | Source: Home / Self Care | Attending: Family Medicine | Admitting: Family Medicine

## 2017-04-21 ENCOUNTER — Emergency Department
Admission: EM | Admit: 2017-04-21 | Discharge: 2017-04-21 | Discharge status: Absent without leave | Payer: Self-pay | Source: Home / Self Care

## 2017-04-21 DIAGNOSIS — B9789 Other viral agents as the cause of diseases classified elsewhere: Secondary | ICD-10-CM | POA: Diagnosis not present

## 2017-04-21 DIAGNOSIS — J069 Acute upper respiratory infection, unspecified: Secondary | ICD-10-CM

## 2017-04-21 MED ORDER — AMOXICILLIN 875 MG PO TABS: 875.0000 mg | ORAL_TABLET | Freq: Two times a day (BID) | ORAL | 0 refills | Status: DC

## 2017-04-21 MED ORDER — FLUCONAZOLE 150 MG PO TABS: ORAL_TABLET | ORAL | 1 refills | Status: DC

## 2017-04-21 NOTE — Discharge Instructions (Signed)
Take plain guaifenesin (1200mg  extended release tabs such as Mucinex) twice daily, with plenty of water, for cough and congestion.  May add Pseudoephedrine (30mg , one or two every 4 to 6 hours) for sinus congestion.  Get adequate rest.   May use Afrin nasal spray (or generic oxymetazoline) each morning for about 5 days and then discontinue.  Also recommend using saline nasal spray several times daily and saline nasal irrigation (AYR is a common brand).  Use Atrovent nasal spray each morning after using Afrin nasal spray and saline nasal irrigation. Try warm salt water gargles for sore throat.  Stop all antihistamines for now, and other non-prescription cough/cold preparations. May take Delsym Cough Suppressant at bedtime for nighttime cough.  Begin Amoxicillin if not improving about one week or if persistent fever develops

## 2017-04-21 NOTE — ED Provider Notes (Signed)
[facility]    CSN: [id number] Arrival date & time: 04/21/17  0957     History   Chief Complaint Chief Complaint  Patient presents with  . Facial Pain  . Nasal Congestion    HPI Alexis Reyes is a 61 y.o. female.   About one week ago patient developed typical cold-like symptoms developing over several days, including mild sore throat, sinus congestion, headache, fatigue, and cough.  Her ears feel clogged without pain.  No fevers, chills, and sweats.   The history is provided by the patient.    Past Medical History:  Diagnosis Date  . Abnormal Pap smear of cervix 2006   colpo  . Hearing loss 05/17/2014  . Iron deficiency 05/17/2014    Patient Active Problem List   Diagnosis Date Noted  . History of UTI 03/26/2016  . History of hematuria 03/26/2016  . 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 Subcutaneous mass 03/26/2016  . 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 Cystocele, grade 3 07/18/2015  . Second degree uterine prolapse 07/18/2015  . PMB (postmenopausal bleeding) 07/18/2015  . Stress incontinence 07/18/2015  . Mild atherosclerosis of carotid artery 12/14/2014  . 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 Hyperglycemia 05/18/2014  . Iron deficiency 05/17/2014  . Hearing loss 05/17/2014    Past Surgical History:  Procedure Laterality Date  . GASTRIC BYPASS  10/2005  . TUBAL LIGATION  12/22/1983    OB History    Gravida Para Term Preterm AB Living   2 2 2     2    SAB TAB Ectopic Multiple Live Births                   Home Medications    Prior to Admission medications   Medication Sig Start Date End Date Taking? Authorizing Provider  amoxicillin (AMOXIL) 875 MG tablet Take 1 tablet (875 mg total) by mouth 2 (two) times daily. (Rx void after 04/29/17) 04/21/17   Kandra Nicolas, MD  aspirin EC 81 MG tablet Take 1 tablet (81 mg total) by mouth daily. 12/14/14   Marcial Pacas, DO  Calcium-Vitamin D-Vitamin K [id number] MG-UNT-MCG CHEW Chew by mouth.    [provider]  fluconazole (DIFLUCAN) 150 MG tablet Take one tab by mouth as a single dose. 04/21/17   Kandra Nicolas, MD  ipratropium (ATROVENT) 0.06 % nasal spray Place 2 sprays into both nostrils every 4 (four) hours as needed for rhinitis. 05/02/16   Gregor Hams, MD  Multiple Vitamins-Minerals (MULTIVITAMIN ADULT PO) Take by mouth.    [provider]  vitamin B-12 (CYANOCOBALAMIN) 1000 MCG tablet Take 1,000 mcg by mouth.    [provider]    Family History Family History  Problem Relation Age of Onset  . Diabetes Mother        father   . Stroke Father     Social History Social History   Tobacco Use  . Smoking status: Never Smoker  . Smokeless tobacco: Never Used  Substance Use Topics  . Alcohol use: No    Alcohol/week: 0.6 oz    Types: 1 Standard drinks or equivalent per week    Frequency: Never  . Drug use: No     Allergies   Patient has no known allergies.   Review of Systems Review of Systems + sore throat + cough No pleuritic pain No wheezing + nasal congestion + post-nasal drainage + sinus pain/pressure No itchy/red eyes ? earache No hemoptysis No SOB No fever,chills No  nausea No vomiting No abdominal pain No diarrhea No urinary symptoms No skin rash + fatigue + myalgias + headache Used OTC meds without relief   Physical Exam Triage Vital Signs ED Triage Vitals  Enc Vitals Group     BP 04/21/17 1029 116/76     Pulse Rate 04/21/17 1029 69     Resp --      Temp 04/21/17 1029 98 F (36.7 C)     Temp Source 04/21/17 1029 Oral     SpO2 04/21/17 1029 100 %     Weight 04/21/17 1030 216 lb (98 kg)     Height 04/21/17 1030 5\' 8"  (1.727 m)     Head Circumference --      Peak Flow --      Pain Score 04/21/17 1030 4     Pain Loc --      Pain Edu? --      Excl. in [place]? --    No data found.  Updated Vital Signs BP 116/76 (BP Location: Right Arm)   Pulse 69   Temp 98 F (36.7 C) (Oral)   Ht 5\' 8"  (1.727 m)   Wt 216  lb (98 kg)   SpO2 100%   BMI 32.84 kg/m   Visual Acuity Right Eye Distance:   Left Eye Distance:   Bilateral Distance:    Right Eye Near:   Left Eye Near:    Bilateral Near:     Physical Exam Nursing notes and Vital Signs reviewed. Appearance:  Patient appears stated age, and in no acute distress Eyes:  Pupils are equal, round, and reactive to light and accomodation.  Extraocular movement is intact.  Conjunctivae are not inflamed  Ears:  Canals normal.  Tympanic membranes normal.  Nose:  Mildly congested turbinates.  No sinus tenderness.  Pharynx:  Normal Neck:  Supple.  Enlarged posterior/lateral nodes are palpated bilaterally, tender to palpation on the left.   Lungs:  Clear to auscultation.  Breath sounds are equal.  Moving air well. Heart:  Regular rate and rhythm without murmurs, rubs, or gallops.  Abdomen:  Nontender without masses or hepatosplenomegaly.  Bowel sounds are present.  No CVA or flank tenderness.  Extremities:  No edema.  Skin:  No rash present.    UC Treatments / Results  Labs (all labs ordered are listed, but only abnormal results are displayed) Labs Reviewed - No data to display  EKG  EKG Interpretation None       Radiology No results found.  Procedures Procedures (including critical care time)  Medications Ordered in UC Medications - No data to display   Initial Impression / Assessment and Plan / UC Course  I have reviewed the triage vital signs and the nursing notes.  Pertinent labs & imaging results that were available during my care of the patient were reviewed by me and considered in my medical decision making (see chart for details).     There is no evidence of bacterial infection today.   Take plain guaifenesin (1200mg  extended release tabs such as Mucinex) twice daily, with plenty of water, for cough and congestion.  May add Pseudoephedrine (30mg , one or two every 4 to 6 hours) for sinus congestion.  Get adequate rest.   May use  Afrin nasal spray (or generic oxymetazoline) each morning for about 5 days and then discontinue.  Also recommend using saline nasal spray several times daily and saline nasal irrigation (AYR is a common brand).  Use Atrovent nasal spray  each morning after using Afrin nasal spray and saline nasal irrigation. Try warm salt water gargles for sore throat.  Stop all antihistamines for now, and other non-prescription cough/cold preparations. May take Delsym Cough Suppressant at bedtime for nighttime cough.  Begin Amoxicillin if not improving about one week or if persistent fever develops (Given a prescription to hold, with an expiration date)  Follow-up with family doctor if not improving about10 days.     Final Clinical Impressions(s) / UC Diagnoses   Final diagnoses:  Viral URI with cough    ED Discharge Orders        Ordered    amoxicillin (AMOXIL) 875 MG tablet  2 times daily     04/21/17 1108    fluconazole (DIFLUCAN) 150 MG tablet     04/21/17 1108           Kandra Nicolas, MD 04/22/17 1525

## 2017-04-21 NOTE — ED Triage Notes (Signed)
Started about a week ago with a runny nose .  Sore throat, head pressure, greenish mucous.

## 2017-04-23 ENCOUNTER — Telehealth: Payer: Self-pay | Admitting: *Deleted

## 2017-04-23 MED ORDER — FLUCONAZOLE 150 MG PO TABS: ORAL_TABLET | ORAL | 1 refills | Status: DC

## 2017-04-23 NOTE — Telephone Encounter (Signed)
Per Dr. Aurelio Brash note, it was recommended that she try symptomatic treatment for viral illness for at least 1 week prior to starting the antibiotic (Amoxicillin).  Please encourage her to try the ipratropium nasal spray for a few more days.  Tessalon 100mg  tab QID PRN for cough can be called into her pharmacy and/or she may try OTC guaifenecin (Mucinex or Robitussin) if needed for cough.  Please encouraged her to try symptomatic treatment as originally suggested by Dr. Assunta Found.  If still having worsening symptoms in 4-5 days, please let our office know. Diflucan should only be taken if she is having vaginal symptoms- thick white discharge, vaginal itching/irritation. The medication should be used as a treatment, not as a preventative unless advised by her GYN.    If she is having vaginal symptoms c/w yeast infection, you may call in 1 additional tab of diflucan 150mg .  Please encouraged f/u with PCP if needed.

## 2017-04-23 NOTE — Telephone Encounter (Signed)
Pt called reports that Amoxicillin is causing Diarrhea and abd cramping. She request to be changed to [place]. She has taken Zpak in the past with no issues. She also reports that she was told she would get 2 tabs of Diflucan, but she only received 1 and would like an rx for another tab. [facility].

## 2017-04-23 NOTE — Telephone Encounter (Signed)
Spoke to pt advised her of Alexis Reyes, [place] note. She will take Mucinex for now and did not want the Rx for Tessalon. She reports having vaginal itching and white d/c immediately after starting ABT. Advised her we call in another Diflucan.

## 2017-05-07 ENCOUNTER — Ambulatory Visit (INDEPENDENT_AMBULATORY_CARE_PROVIDER_SITE_OTHER): Payer: Managed Care, Other (non HMO)

## 2017-05-07 DIAGNOSIS — Z1231 Encounter for screening mammogram for malignant neoplasm of breast: Secondary | ICD-10-CM | POA: Diagnosis not present

## 2017-05-29 LAB — HM COLONOSCOPY

## 2017-05-30 LAB — HM COLONOSCOPY

## 2017-09-02 IMAGING — US US EXTREM LOW*L* LIMITED
1 series · 14 of 15 positions shown · non-contrast
Comparison: None

CLINICAL DATA: Left anterior thigh lump for 2 months.

EXAM:
ULTRASOUND LEFT LOWER EXTREMITY LIMITED
TECHNIQUE: Ultrasound examination of the lower extremity soft tissues was
performed in the area of clinical concern.

[Series 1: us extrem low*left* limited · 0.04mm/px · 15 acquisitions, 14 frames shown]
[im 1/15]
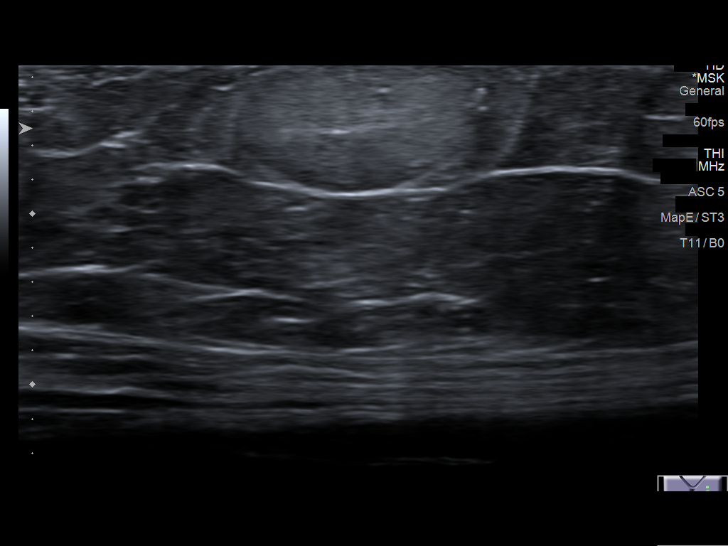
[im 2/15]
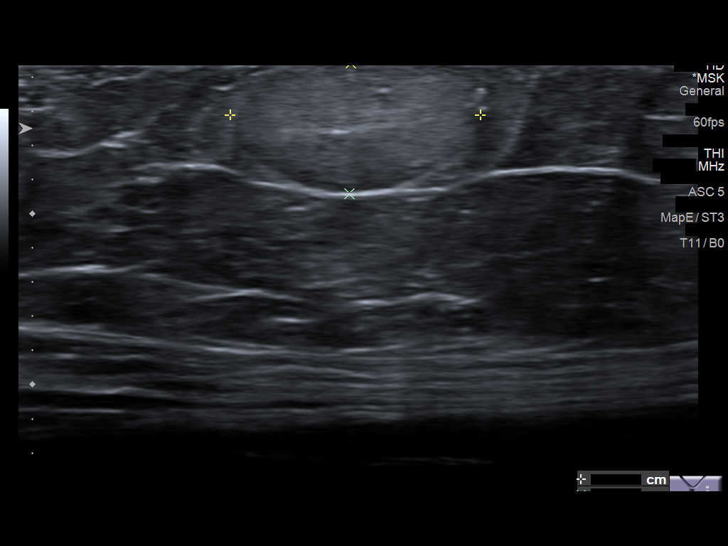
[im 3/15]
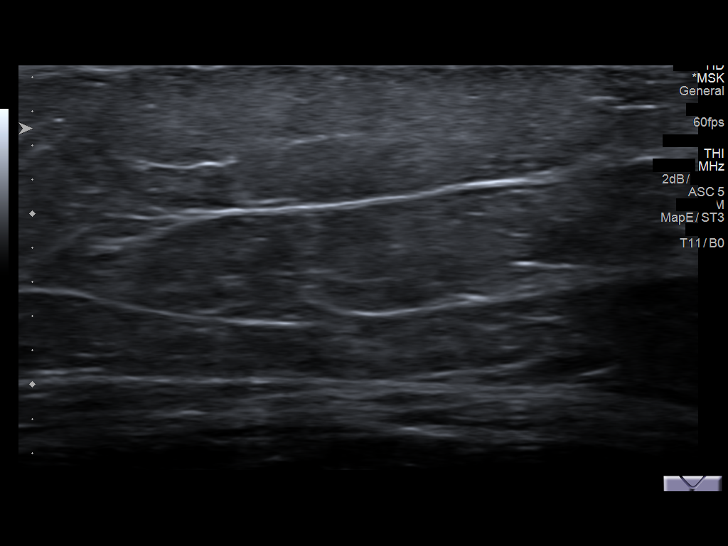
[im 4/15]
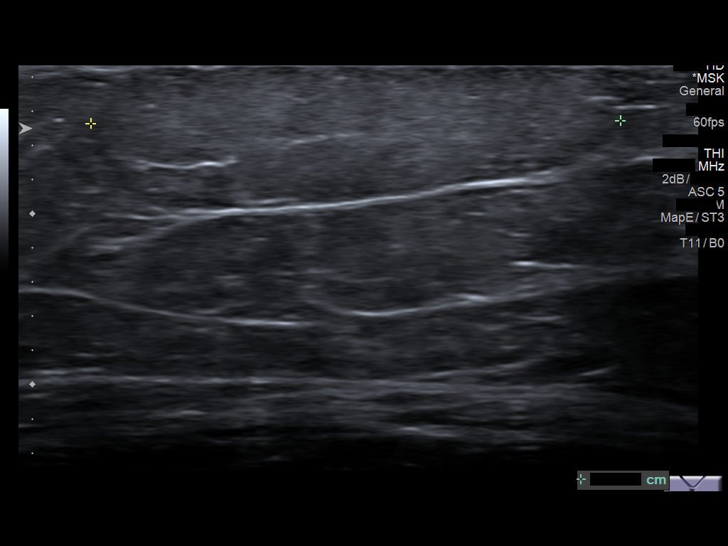
[im 5/15]
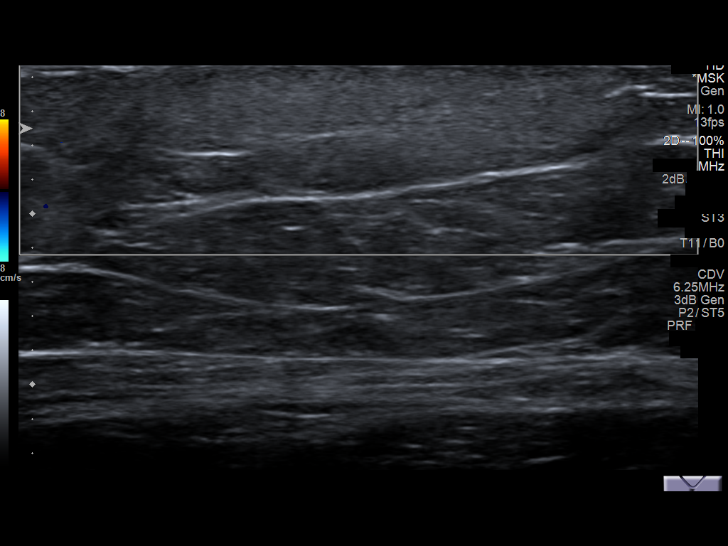
[im 6/15]
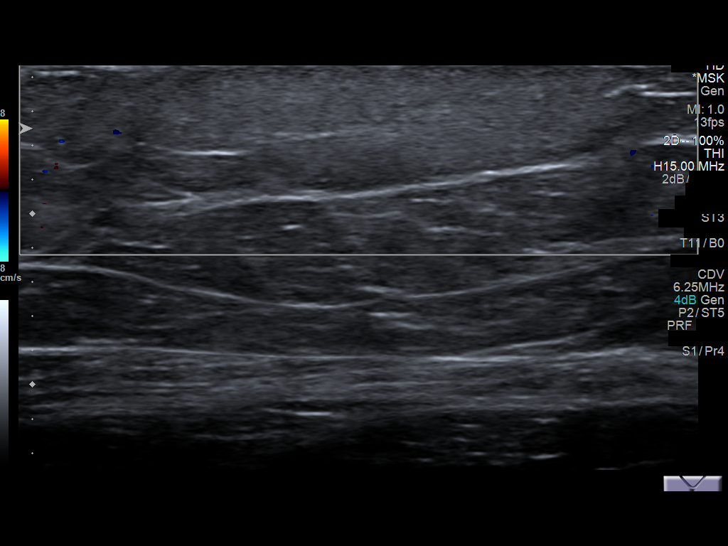
[im 7/15]
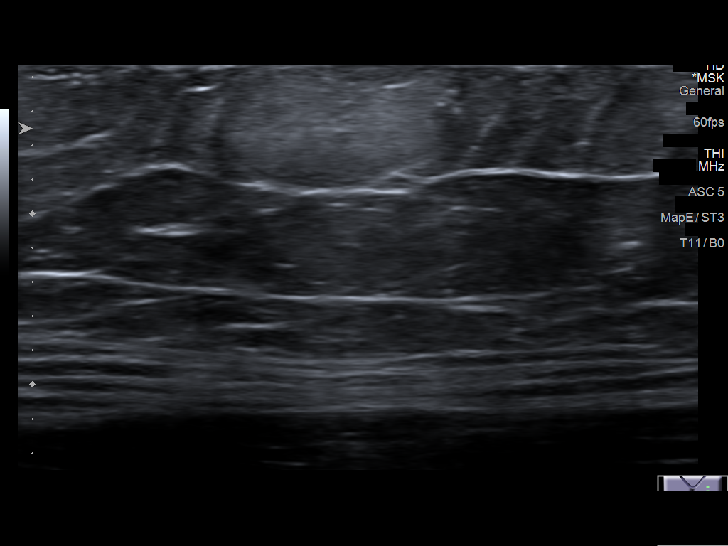
[im 9/15]
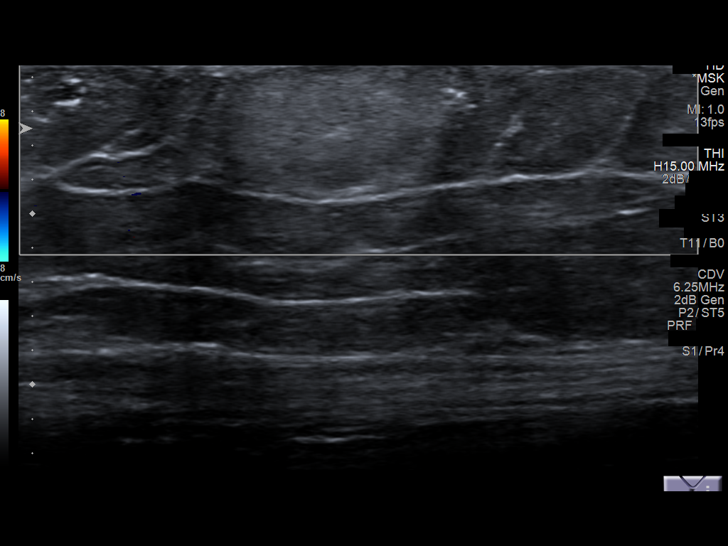
[im 10/15]
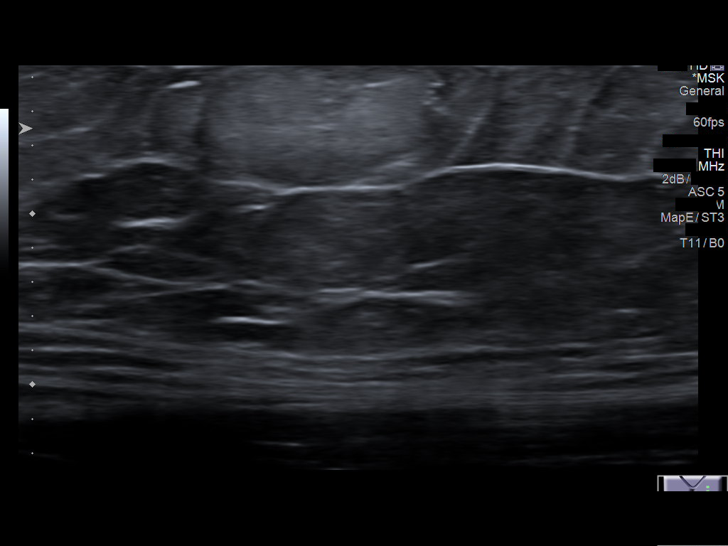
[im 11/15]
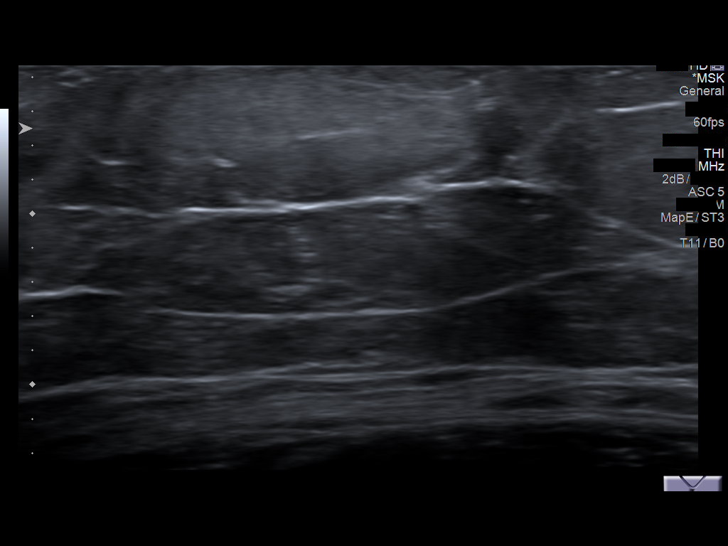
[im 12/15]
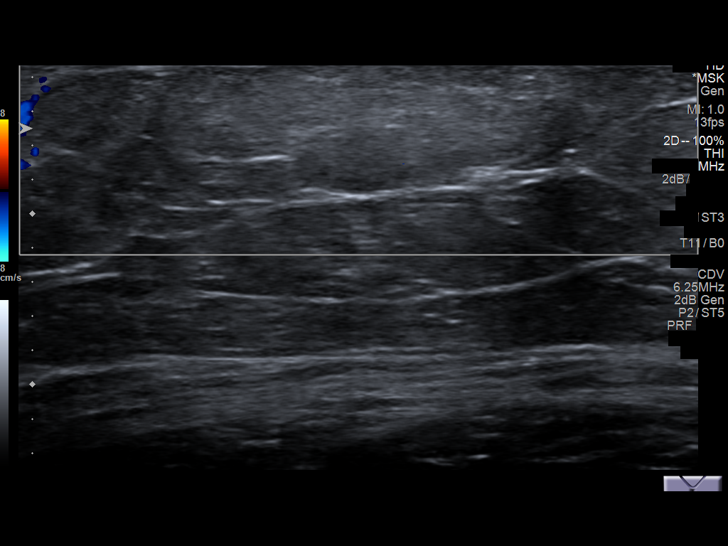
[im 13/15]
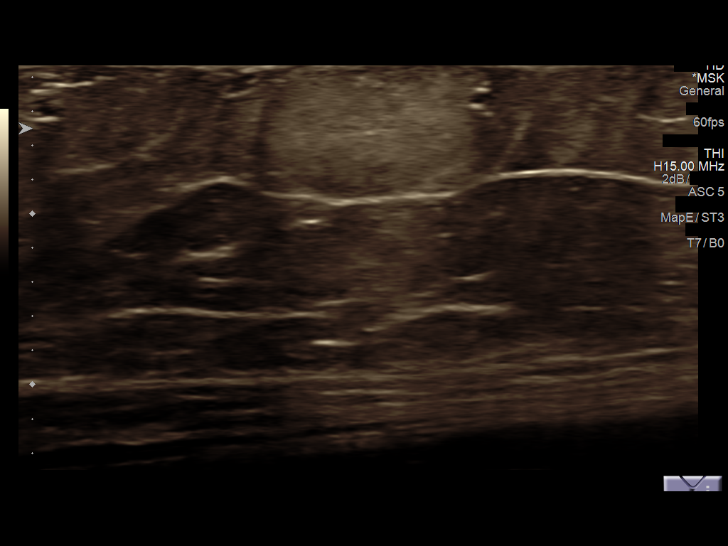
[im 14/15]
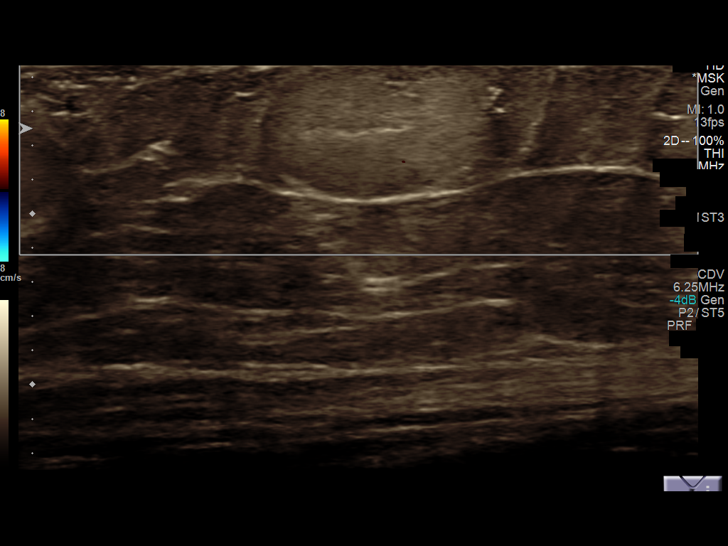
[im 15/15]
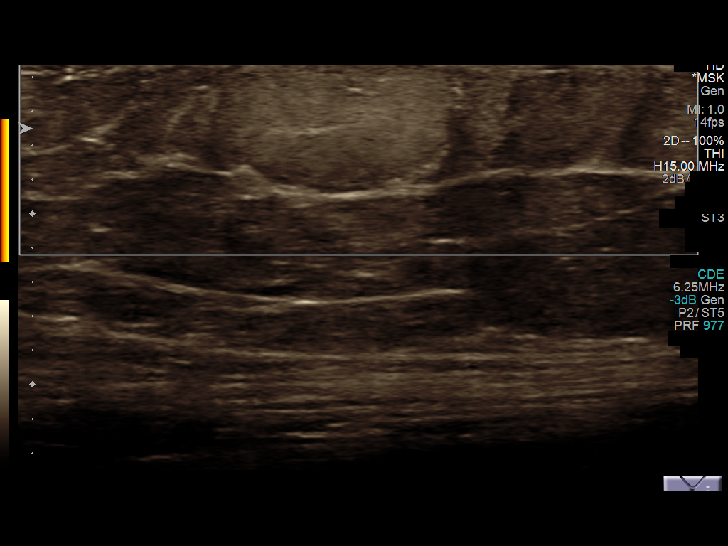

[14 of 15 positions shown; findings below may reference images not displayed]

FINDINGS: There is a solid hyperechoic nodule in the area concern in the left
anterior upper thigh measuring 1.550.8 x 3.1 cm. This is
well-circumscribed. No internal blood flow. No cystic components. No
other soft tissue abnormality.
IMPRESSION: Well-circumscribed 3 cm hyperechoic area within the subcutaneous
soft tissues most compatible with lipoma.

## 2017-09-02 IMAGING — US US ABDOMEN COMPLETE
1 series · 14 of 25 positions shown · non-contrast
Comparison: None.

CLINICAL DATA: Right upper quadrant abdominal pain for the past 6
months, primarily after eating. Some nausea with the pain. Known
gallstones.

EXAM:
ABDOMEN ULTRASOUND COMPLETE

[Series 1: us abdomen complete · 0.20mm/px · 14 of 102 slices shown]
[im 1/102]
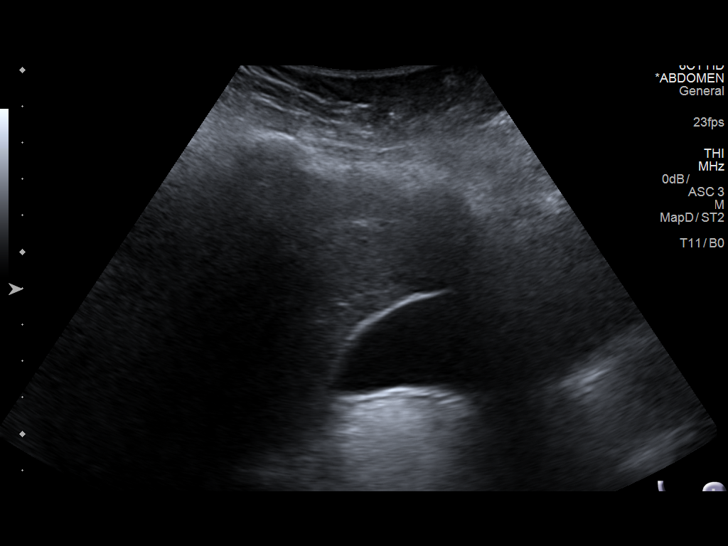
[im 9/102]
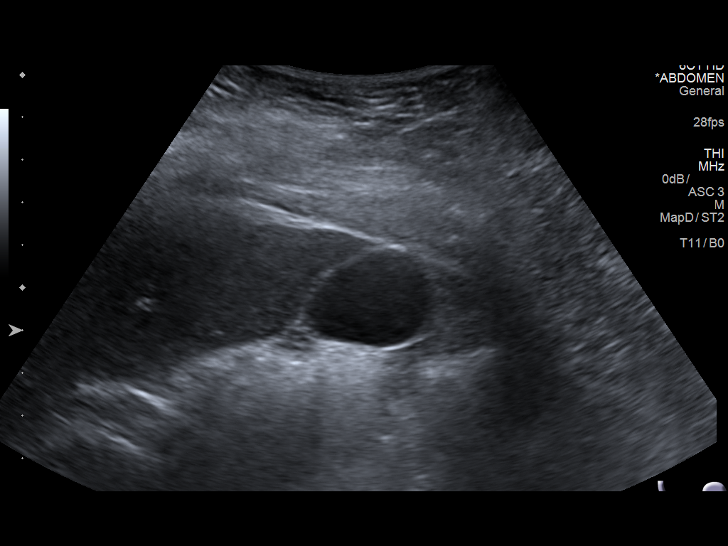
[im 17/102]
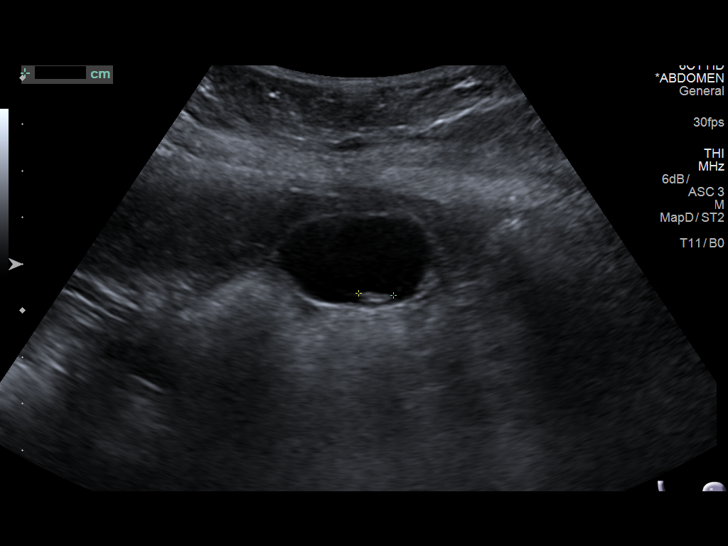
[im 26/102]
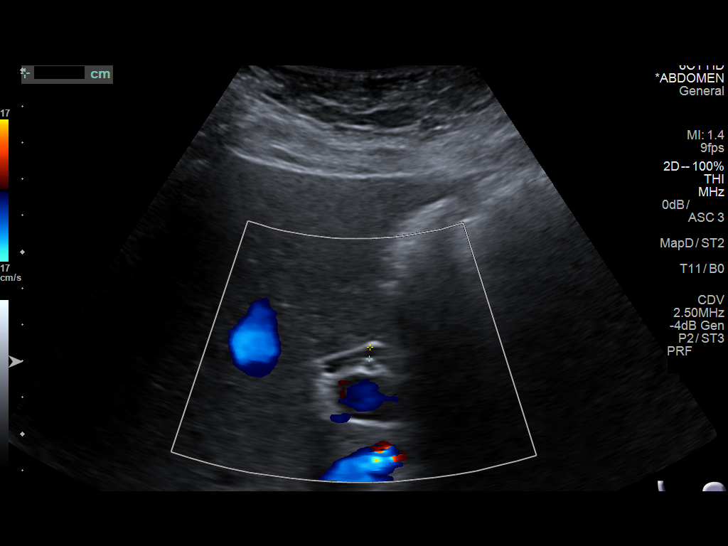
[im 34/102]
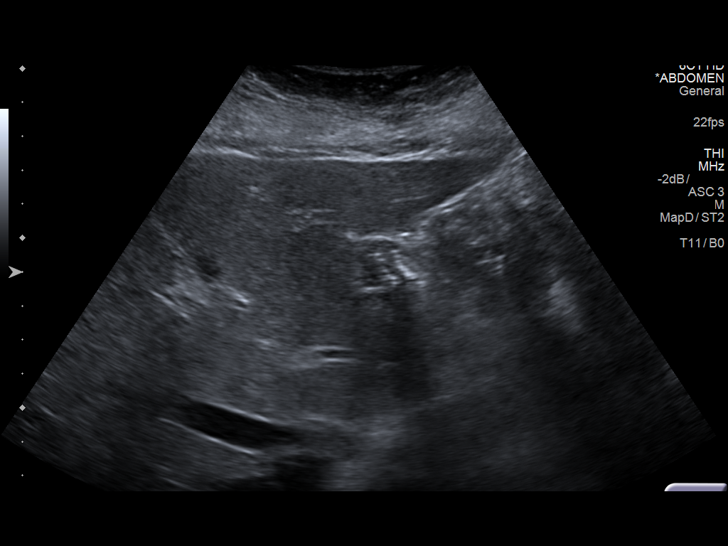
[im 38/102]
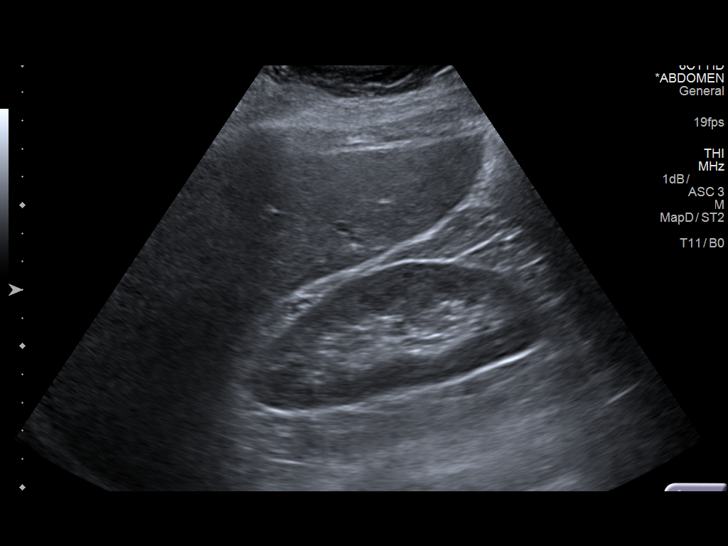
[im 47/102]
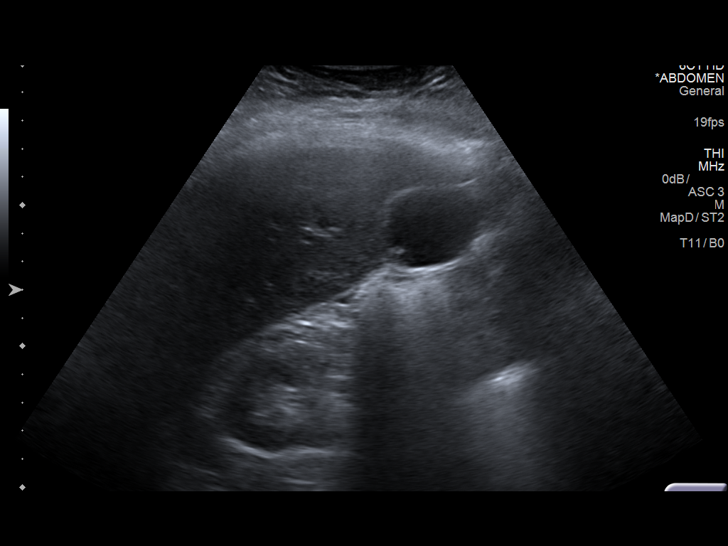
[im 55/102]
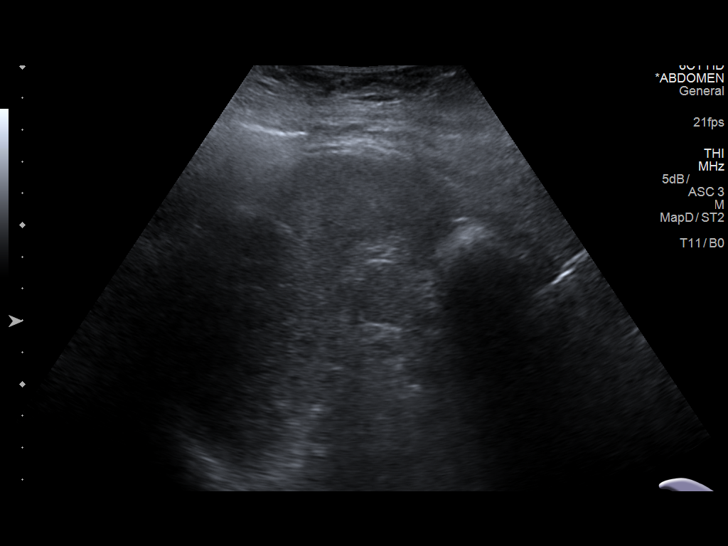
[im 64/102]
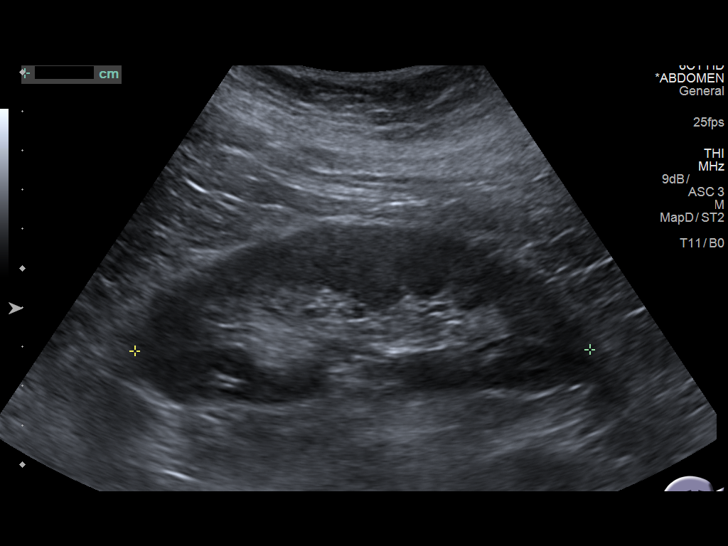
[im 68/102]
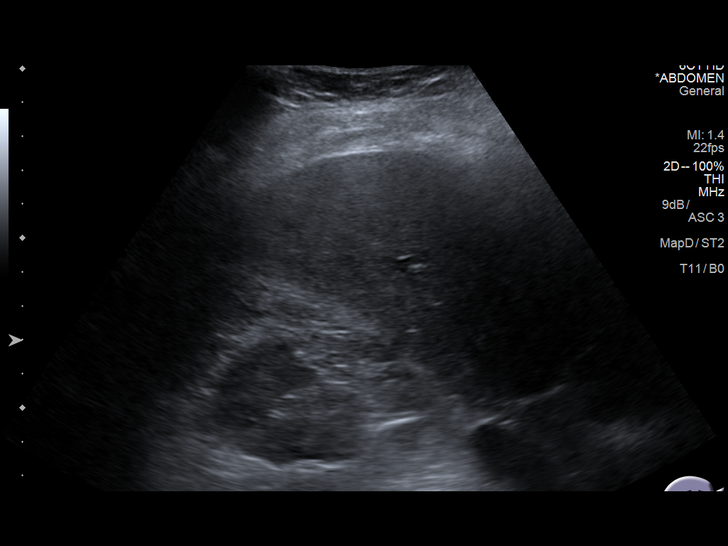
[im 76/102]
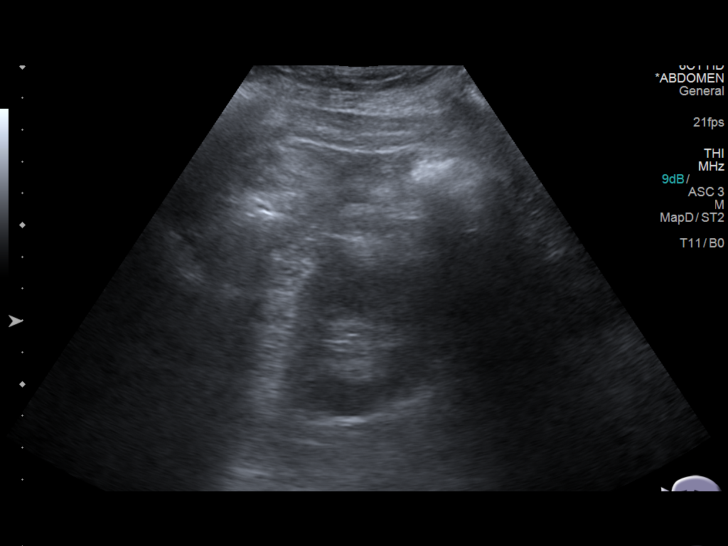
[im 85/102]
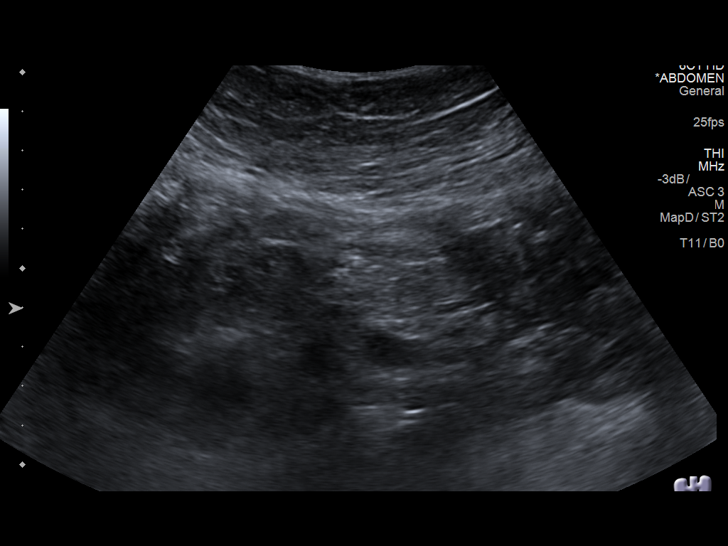
[im 93/102]
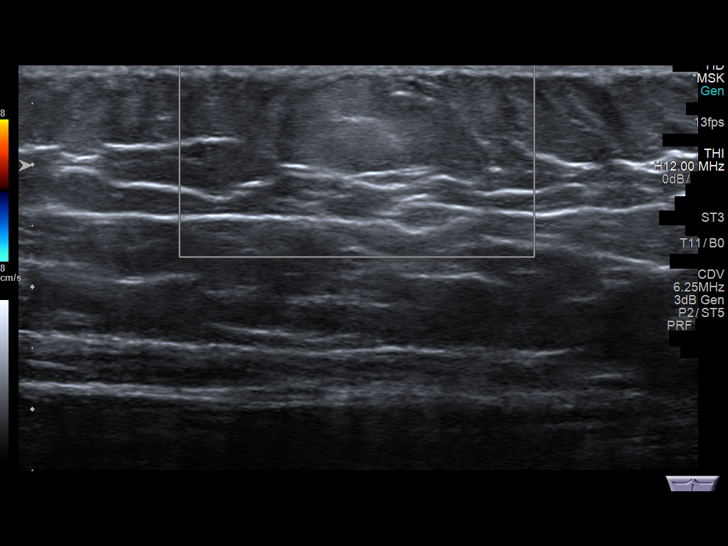
[im 102/102]
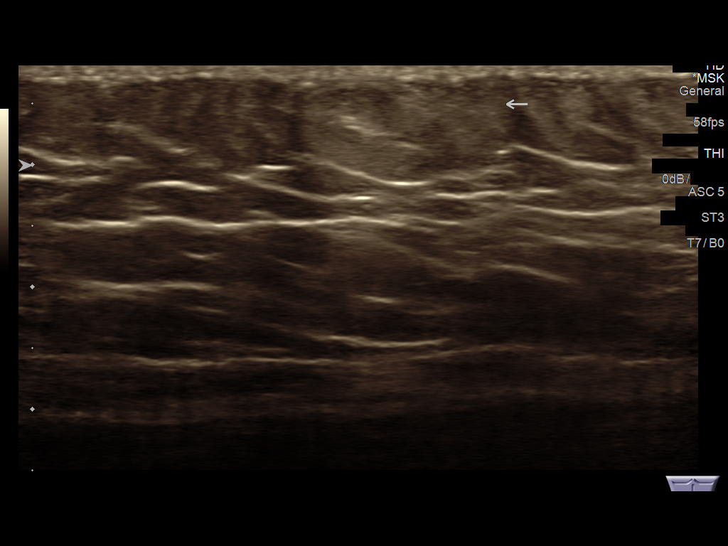

[14 of 25 positions shown; findings below may reference images not displayed]

FINDINGS: Gallbladder: 2 small, mobile gallstones in the gallbladder, the
largest measuring 8 mm in maximum diameter. No gallbladder wall
thickening or pericholecystic fluid. The patient was not focally
tender over the gallbladder.

Common bile duct: Diameter: 3.1 mm

Liver: No focal lesion identified. Within normal limits in
parenchymal echogenicity.

IVC: No abnormality visualized.

Pancreas: Visualized portion unremarkable.

Spleen: Size and appearance within normal limits.

Right Kidney: Length: 11.6 cm. Echogenicity within normal limits. No
mass or hydronephrosis visualized.

Left Kidney: Length: 10.9 cm. Echogenicity within normal limits. No
mass or hydronephrosis visualized.

Abdominal aorta: No aneurysm visualized.

Other findings: None.
IMPRESSION: 1. Cholelithiasis without evidence cholecystitis.
2. Otherwise, unremarkable examination.

## 2017-12-16 ENCOUNTER — Encounter: Payer: Self-pay | Admitting: Osteopathic Medicine

## 2017-12-16 ENCOUNTER — Ambulatory Visit (INDEPENDENT_AMBULATORY_CARE_PROVIDER_SITE_OTHER): Payer: Managed Care, Other (non HMO) | Admitting: Osteopathic Medicine

## 2017-12-16 VITALS — BP 110/61 | HR 66 | Temp 98.1°F | Wt 218.1 lb

## 2017-12-16 DIAGNOSIS — Z9884 Bariatric surgery status: Secondary | ICD-10-CM | POA: Diagnosis not present

## 2017-12-16 DIAGNOSIS — R350 Frequency of micturition: Secondary | ICD-10-CM

## 2017-12-16 DIAGNOSIS — Z0000 Encounter for general adult medical examination without abnormal findings: Secondary | ICD-10-CM | POA: Diagnosis not present

## 2017-12-16 DIAGNOSIS — D171 Benign lipomatous neoplasm of skin and subcutaneous tissue of trunk: Secondary | ICD-10-CM | POA: Diagnosis not present

## 2017-12-16 DIAGNOSIS — M62830 Muscle spasm of back: Secondary | ICD-10-CM | POA: Diagnosis not present

## 2017-12-16 LAB — POCT URINALYSIS DIPSTICK
BILIRUBIN UA: NEGATIVE
GLUCOSE UA: NEGATIVE
KETONES UA: NEGATIVE
Nitrite, UA: NEGATIVE
Protein, UA: NEGATIVE
SPEC GRAV UA: 1.02 (ref 1.010–1.025)
Urobilinogen, UA: 0.2 E.U./dL
pH, UA: 5.5 (ref 5.0–8.0)

## 2017-12-16 MED ORDER — CYCLOBENZAPRINE HCL 10 MG PO TABS: 5.0000 mg | ORAL_TABLET | Freq: Three times a day (TID) | ORAL | 1 refills | Status: AC | PRN

## 2017-12-16 NOTE — Progress Notes (Signed)
HPI: Alexis Reyes is a 62 y.o. female who  has a past medical history of Abnormal Pap smear of cervix (2006), Hearing loss (05/17/2014), and Iron deficiency (05/17/2014).  she presents to [facility] today, 12/16/17,  for chief complaint of: Would like blood work   Haven't seen this patient is some time. Last visit with me was almost 2 years ago.   She reports her insurance is running out and she would like a check-up. She is fasting and would like all blood work done.   She is always hungry and always thirsty, ongoing for about a month, as well as urinary frequency without burning or abdominal/flank pain. She is worried about diabetes given her family history.   She has some back pain and concerned lipoma might be causing this. She has some soreness in R middle back below scapula. No injury. She is not on an exercise regimen. No OTC meds tried.   Hx gastric bypass, due for labs to monitor nutrient deficiencies.   See preventive care, reviewed as below. Reports she's had a colonoscopy this year  Mammogram 04/2017 Tdap declined Flu shot declined  Pap 06/2015 NILM and neg HPV       Past medical, surgical, social and family history reviewed:  Patient Active Problem List   Diagnosis Date Noted  . History of UTI 03/26/2016  . History of hematuria 03/26/2016  . 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 Subcutaneous mass 03/26/2016  . 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 Cystocele, grade 3 07/18/2015  . Second degree uterine prolapse 07/18/2015  . PMB (postmenopausal bleeding) 07/18/2015  . Stress incontinence 07/18/2015  . Mild atherosclerosis of carotid artery 12/14/2014  . 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 Hyperglycemia 05/18/2014  . Iron deficiency 05/17/2014  . Hearing loss 05/17/2014    Past Surgical History:  Procedure Laterality Date  . GASTRIC BYPASS  10/2005  . TUBAL LIGATION  12/22/1983    Social History   Tobacco Use  . Smoking  status: Never Smoker  . Smokeless tobacco: Never Used  Substance Use Topics  . Alcohol use: No    Alcohol/week: 1.0 standard drinks    Types: 1 Standard drinks or equivalent per week    Frequency: Never    Family History  Problem Relation Age of Onset  . Diabetes Mother        father   . Stroke Father      Current medication list and allergy/intolerance information reviewed:    Current Meds  Medication Sig  . aspirin EC 81 MG tablet Take 1 tablet (81 mg total) by mouth daily.  . Calcium-Vitamin D-Vitamin K 433-295-18 MG-UNT-MCG CHEW Chew by mouth.  . Multiple Vitamins-Minerals (MULTIVITAMIN ADULT PO) Take by mouth.  . vitamin B-12 (CYANOCOBALAMIN) 1000 MCG tablet Take 1,000 mcg by mouth.     No Known Allergies    Review of Systems:  Constitutional:  No  fever, no chills, No recent illness, No unintentional weight changes. +fatigue.   HEENT: No  headache, no vision change, no hearing change, No sore throat, No  sinus pressure  Cardiac: No  chest pain, No  pressure, No palpitations, No  Orthopnea  Respiratory:  No  shortness of breath. No  Cough  Gastrointestinal: +occasional abdominal pain, No  nausea, No  vomiting,  No  blood in stool, No  diarrhea, No  constipation   Musculoskeletal: +back myalgia/arthralgia  Skin: No  Rash, No other wounds/concerning lesions other than lipomas  Genitourinary: No  incontinence,  No  abnormal genital bleeding, No abnormal genital discharge  Hem/Onc: No  easy bruising/bleeding  Endocrine: No cold intolerance,  No heat intolerance. +polyuria/polydipsia/polyphagia   Neurologic: No  weakness, No  dizziness  Psychiatric: No  concerns with depression, No  concerns with anxiety, No sleep problems, No mood problems  Exam:  BP 110/61 (BP Location: Left Arm, Patient Position: Sitting, Cuff Size: Normal)   Pulse 66   Temp 98.1 F (36.7 C) (Oral)   Wt 218 lb 1.6 oz (98.9 kg)   BMI 33.16 kg/m   Constitutional: VS see above. General  Appearance: alert, well-developed, well-nourished, NAD  Eyes: Normal lids and conjunctive, non-icteric sclera  Ears, Nose, Mouth, Throat: MMM, Normal external inspection ears/nares/mouth/lips/gums.   Neck: No masses, trachea midline. No thyroid enlargement. No tenderness/mass appreciated. No lymphadenopathy  Respiratory: Normal respiratory effort. no wheeze, no rhonchi, no rales  Cardiovascular: S1/S2 normal, no murmur, no rub/gallop auscultated. RRR. No lower extremity edema.  Gastrointestinal: Nontender, no masses. No hepatomegaly, no splenomegaly. No hernia appreciated. Bowel sounds normal. Rectal exam deferred.   Musculoskeletal: Gait normal. No clubbing/cyanosis of digits.   Neurological: Normal balance/coordination. No tremor. No cranial nerve deficit on limited exam. Motor and sensation intact and symmetric. Cerebellar reflexes intact. Tenderness to palpation on R mid-back lower thoracic/ribs c/w muscle spasm   Skin: warm, dry, intact. No rash/ulcer. No concerning nevi or subq nodules on limited exam.    Psychiatric: Normal judgment/insight. Normal mood and affect. Oriented x3.    Results for orders placed or performed in visit on 12/16/17 (from the past 72 hour(s))  POCT Urinalysis Dipstick     Status: Abnormal   Collection Time: 12/16/17  8:27 AM  Result Value Ref Range   Color, UA YELLOW    Clarity, UA CLEAR    Glucose, UA Negative Negative   Bilirubin, UA Negative    Ketones, UA Negative    Spec Grav, UA 1.020 1.010 - 1.025   Blood, UA Moderate    pH, UA 5.5 5.0 - 8.0   Protein, UA Negative Negative   Urobilinogen, UA 0.2 0.2 or 1.0 E.U./dL   Nitrite, UA Negative    Leukocytes, UA Small (1+) (A) Negative   Appearance     Odor      No results found.   ASSESSMENT/PLAN:   Urine frequency - Plan: POCT Urinalysis Dipstick, Urine Culture, Urinalysis, microscopic only  History of gastric bypass - Plan: CBC with Differential/Platelet, Ferritin, Folate, Iron and  TIBC, Lipid panel, VITAMIN D 25 Hydroxy (Vit-D Deficiency, Fractures), Vitamin B1, Vitamin B12, Zinc, COMPLETE METABOLIC PANEL WITH GFR, TSH  Lipoma of torso - benign, advised I don't think this is related to pain   Muscle spasm of back - stretching and NSAID's advised, Rx flexeril as needed   Annual physical exam - Preventive care reviewed and complete exam done today.  See patient instructions, we did address other problems as well     Patient Instructions   Review preventive care:   General Preventive Care  Most recent routine screening lipids/other labs: ordered today   Tobacco: don't! Alcohol: moderation is ok for most people. Recreational/Illicit Drugs: don't!  Exercise: as tolerated to reduce risk of cardiovascular disease and diabetes  Mental health: if need for mental health care (medicines, counseling, other), or concerns about moods, please let me know!    Sexual health: if need for STD testing, or concern about pain/libido, please let me know!   Vaccines  Flu vaccine: recommended every fall (by  Halloween!)  Shingles vaccine: Shingrix recommended after age 37, recommend you get this at your pharmacy   Pneumonia vaccines: Prevnar and Pneumovax recommended after age 48, sooner if diabetes, COPD/asthma, others  Tetanus booster: Tdap recommended every 10 years  Cancer screenings   Colon cancer screening: recommended routinely starting at age 73  Breast cancer screening: mammogram recommended annually after age 59  Cervical cancer screening: every 1 to 5 years depending on age and other risk factors. Can stop at age 81 or w/ hysterectomy as long as previous testing was normal.   Infection screenings . HIV: recommended screening at least once age 17-65, more often if risk factors  . Gonorrhea/Chlamydia: screening as needed . Hepatitis C: recommended for anyone born 01-1963  Other . Aspirin: not needed if no heart attack or stroke.  Donzetta Kohut Density Test:  recommended for women at age 98 . Advanced Directive: Living Will and/or Healthcare Power of Attorney recommended for everyone, regardless of age or health . Cholesterol: recommended screening annually . Diabetes: recommended screening annually   Health Maintenance  Topic Date Due  .  Hepatitis C: One time screening is recommended by Center for Disease Control  (CDC) for  adults born from [birth year] through 1964.   1955-11-29  . HIV Screening  03/14/1972  . Tetanus Vaccine  04/22/2018*  . Flu Shot  08/05/2018*  . Mammogram  05/07/2018  . Pap Smear  07/17/2020  . Colon Cancer Screening (need records to update) As directed by GI  *Topic was postponed. The date shown is not the original due date.    Other:   Urine frequency: no sugars in urine but red blood cells were present. Will send urine for further testing to rule out infection and to confirm presence of blood. If blood persists, we should discuss further testing to rule out kidney stones, bladder abnormality, or other problem.   Back spasm: see printed instructions and can try muscle relaxer as needed. Recommend Ibuprofen or Tylenol as needed for aches/pains. Can also use heart/stretching. We can also send referral to physical therapy if you'd like, please let me know if home exercises and medicines aren't working.         Please note: Preventive care issues were addressed today as part of annual wellness physical, and this care should be covered under your insurance. However there were other medical issues which were also addressed today (urinary concerns, back pain), and insurance may bill you separately for a "problem-based visit" for this care. Any questions or concerns about charges which may appear on your billing statement should be directed to your insurance company or to [facility] billing department, and they will contact our office if there are further concerns.          Visit summary with medication list and pertinent  instructions was printed for patient to review. All questions at time of visit were answered - patient instructed to contact office with any additional concerns. ER/RTC precautions were reviewed with the patient.   Follow-up plan: Return for annual physical in one year, sooner as needed and/or depending on lab results! .  Note: Total time spent on problem-based portion of visit was 25 minutes, greater than 50% of the time was spent face-to-face counseling and coordinating care for the following: The primary encounter diagnosis was Urine frequency. Diagnoses of History of gastric bypass, Lipoma of torso, Muscle spasm of back were also pertinent to this visit.   Please note: voice recognition software was used to  produce this document, and typos may escape review. Please contact Dr. Sheppard Coil for any needed clarifications.

## 2017-12-16 NOTE — Patient Instructions (Addendum)
Review preventive care:   General Preventive Care  Most recent routine screening lipids/other labs: ordered today   Tobacco: don't! Alcohol: moderation is ok for most people. Recreational/Illicit Drugs: don't!  Exercise: as tolerated to reduce risk of cardiovascular disease and diabetes  Mental health: if need for mental health care (medicines, counseling, other), or concerns about moods, please let me know!    Sexual health: if need for STD testing, or concern about pain/libido, please let me know!   Vaccines  Flu vaccine: recommended every fall (by Halloween!)  Shingles vaccine: Shingrix recommended after age 63, recommend you get this at your pharmacy   Pneumonia vaccines: Prevnar and Pneumovax recommended after age 72, sooner if diabetes, COPD/asthma, others  Tetanus booster: Tdap recommended every 10 years  Cancer screenings   Colon cancer screening: recommended routinely starting at age 19  Breast cancer screening: mammogram recommended annually after age 44  Cervical cancer screening: every 1 to 5 years depending on age and other risk factors. Can stop at age 71 or w/ hysterectomy as long as previous testing was normal.   Infection screenings . HIV: recommended screening at least once age 55-65, more often if risk factors  . Gonorrhea/Chlamydia: screening as needed . Hepatitis C: recommended for anyone born 31-1964  Other . Aspirin: not needed if no heart attack or stroke.  Donzetta Kohut Density Test: recommended for women at age 84 . Advanced Directive: Living Will and/or Healthcare Power of Attorney recommended for everyone, regardless of age or health . Cholesterol: recommended screening annually . Diabetes: recommended screening annually   Health Maintenance  Topic Date Due  .  Hepatitis C: One time screening is recommended by Center for Disease Control  (CDC) for  adults born from [birth year] through 1964.   1955-09-20  . HIV Screening  03/14/1972  . Tetanus Vaccine   04/22/2018*  . Flu Shot  08/05/2018*  . Mammogram  05/07/2018  . Pap Smear  07/17/2020  . Colon Cancer Screening (need records to update) As directed by GI  *Topic was postponed. The date shown is not the original due date.    Other:   Urine frequency: no sugars in urine but red blood cells were present. Will send urine for further testing to rule out infection and to confirm presence of blood. If blood persists, we should discuss further testing to rule out kidney stones, bladder abnormality, or other problem.   Back spasm: see printed instructions and can try muscle relaxer as needed. Recommend Ibuprofen or Tylenol as needed for aches/pains. Can also use heart/stretching. We can also send referral to physical therapy if you'd like, please let me know if home exercises and medicines aren't working.         Please note: Preventive care issues were addressed today as part of annual wellness physical, and this care should be covered under your insurance. However there were other medical issues which were also addressed today (urinary concerns, back pain), and insurance may bill you separately for a "problem-based visit" for this care. Any questions or concerns about charges which may appear on your billing statement should be directed to your insurance company or to [facility] billing department, and they will contact our office if there are further concerns.

## 2017-12-16 NOTE — Addendum Note (Signed)
Addended by: Maryla Morrow on: 12/16/2017 02:58 PM   Modules accepted: Orders

## 2017-12-17 LAB — URINE CULTURE
MICRO NUMBER:: 91023489
Result:: NO GROWTH
SPECIMEN QUALITY:: ADEQUATE

## 2017-12-17 LAB — URINALYSIS, MICROSCOPIC ONLY
BACTERIA UA: NONE SEEN /HPF
Hyaline Cast: NONE SEEN /LPF

## 2017-12-20 LAB — CBC WITH DIFFERENTIAL/PLATELET
BASOS PCT: 0.9 %
Basophils Absolute: 67 cells/uL (ref 0–200)
EOS ABS: 163 {cells}/uL (ref 15–500)
EOS PCT: 2.2 %
HCT: 36.3 % (ref 35.0–45.0)
Hemoglobin: 11.2 g/dL — ABNORMAL LOW (ref 11.7–15.5)
Lymphs Abs: 2324 cells/uL (ref 850–3900)
MCH: 23.7 pg — AB (ref 27.0–33.0)
MCHC: 30.9 g/dL — ABNORMAL LOW (ref 32.0–36.0)
MCV: 76.9 fL — ABNORMAL LOW (ref 80.0–100.0)
MONOS PCT: 8.3 %
MPV: 9.6 fL (ref 7.5–12.5)
NEUTROS ABS: 4233 {cells}/uL (ref 1500–7800)
Neutrophils Relative %: 57.2 %
PLATELETS: 338 10*3/uL (ref 140–400)
RBC: 4.72 10*6/uL (ref 3.80–5.10)
RDW: 14 % (ref 11.0–15.0)
TOTAL LYMPHOCYTE: 31.4 %
WBC mixed population: 614 cells/uL (ref 200–950)
WBC: 7.4 10*3/uL (ref 3.8–10.8)

## 2017-12-20 LAB — LIPID PANEL
Cholesterol: 164 mg/dL (ref <200)
HDL: 48 mg/dL — ABNORMAL LOW (ref >50)
LDL CHOLESTEROL (CALC): 98 mg/dL
Non-HDL Cholesterol (Calc): 116 mg/dL (calc) (ref <130)
Total CHOL/HDL Ratio: 3.4 (calc) (ref <5.0)
Triglycerides: 89 mg/dL (ref <150)

## 2017-12-20 LAB — COMPLETE METABOLIC PANEL WITH GFR
AG RATIO: 1.4 (calc) (ref 1.0–2.5)
ALT: 9 U/L (ref 6–29)
AST: 14 U/L (ref 10–35)
Albumin: 4 g/dL (ref 3.6–5.1)
Alkaline phosphatase (APISO): 89 U/L (ref 33–130)
BUN: 14 mg/dL (ref 7–25)
CO2: 27 mmol/L (ref 20–32)
CREATININE: 0.9 mg/dL (ref 0.50–0.99)
Calcium: 9.4 mg/dL (ref 8.6–10.4)
Chloride: 106 mmol/L (ref 98–110)
GFR, Est African American: 81 mL/min/{1.73_m2} (ref >=60)
GFR, Est Non African American: 69 mL/min/{1.73_m2} (ref >=60)
GLOBULIN: 2.9 g/dL (ref 1.9–3.7)
Glucose, Bld: 82 mg/dL (ref 65–99)
POTASSIUM: 4.4 mmol/L (ref 3.5–5.3)
SODIUM: 141 mmol/L (ref 135–146)
Total Bilirubin: 0.4 mg/dL (ref 0.2–1.2)
Total Protein: 6.9 g/dL (ref 6.1–8.1)

## 2017-12-20 LAB — TEST AUTHORIZATION

## 2017-12-20 LAB — ZINC: Zinc: 61 ug/dL (ref 60–130)

## 2017-12-20 LAB — IRON,TIBC AND FERRITIN PANEL
%SAT: 11 % (calc) — ABNORMAL LOW (ref 16–45)
Ferritin: 6 ng/mL — ABNORMAL LOW (ref 16–232)
IRON: 45 ug/dL (ref 45–160)
TIBC: 398 mcg/dL (calc) (ref 250–450)

## 2017-12-20 LAB — VITAMIN B1: Vitamin B1 (Thiamine): 21 nmol/L (ref 8–30)

## 2017-12-20 LAB — VITAMIN D 25 HYDROXY (VIT D DEFICIENCY, FRACTURES): Vit D, 25-Hydroxy: 33 ng/mL (ref 30–100)

## 2017-12-20 LAB — FOLATE: FOLATE: 16.1 ng/mL

## 2017-12-20 LAB — TSH: TSH: 2.03 m[IU]/L (ref 0.40–4.50)

## 2017-12-20 LAB — VITAMIN B12: VITAMIN B 12: 389 pg/mL (ref 200–1100)

## 2017-12-25 MED ORDER — FERROUS SULFATE 325 (65 FE) MG PO TBEC: 325.0000 mg | DELAYED_RELEASE_TABLET | Freq: Every day | ORAL | 3 refills | Status: AC

## 2017-12-25 NOTE — Addendum Note (Signed)
Addended by: Maryla Morrow on: 12/25/2017 07:35 AM   Modules accepted: Orders

## 2018-04-09 ENCOUNTER — Encounter: Payer: Self-pay | Admitting: Osteopathic Medicine
